# Patient Record
Sex: Female | Born: 1945
Health system: Southern US, Community
[De-identification: ages and names within clinical notes are randomized; demographics above are authoritative.]

## PROBLEM LIST (undated history)

## (undated) DIAGNOSIS — R51 Headache: Secondary | ICD-10-CM

## (undated) DIAGNOSIS — N6009 Solitary cyst of unspecified breast: Secondary | ICD-10-CM

## (undated) DIAGNOSIS — I519 Heart disease, unspecified: Secondary | ICD-10-CM

## (undated) DIAGNOSIS — J302 Other seasonal allergic rhinitis: Secondary | ICD-10-CM

## (undated) DIAGNOSIS — R519 Headache, unspecified: Secondary | ICD-10-CM

## (undated) DIAGNOSIS — I10 Essential (primary) hypertension: Secondary | ICD-10-CM

## (undated) DIAGNOSIS — E669 Obesity, unspecified: Secondary | ICD-10-CM

## (undated) DIAGNOSIS — N63 Unspecified lump in unspecified breast: Secondary | ICD-10-CM

## (undated) DIAGNOSIS — R002 Palpitations: Secondary | ICD-10-CM

## (undated) HISTORY — DX: Essential (primary) hypertension: I10

## (undated) HISTORY — DX: Solitary cyst of unspecified breast: N60.09

## (undated) HISTORY — DX: Unspecified lump in unspecified breast: N63.0

## (undated) HISTORY — DX: Obesity, unspecified: E66.9

## (undated) HISTORY — PX: TUBAL LIGATION: SHX77

## (undated) HISTORY — DX: Heart disease, unspecified: I51.9

---

## 1985-05-30 HISTORY — PX: MECKEL DIVERTICULUM EXCISION: SHX314

## 1985-05-30 HISTORY — PX: ABDOMINAL HYSTERECTOMY: SHX81

## 1985-05-30 HISTORY — PX: APPENDECTOMY: SHX54

## 2003-05-31 DIAGNOSIS — I10 Essential (primary) hypertension: Secondary | ICD-10-CM

## 2003-05-31 HISTORY — DX: Essential (primary) hypertension: I10

## 2008-05-30 HISTORY — PX: BREAST BIOPSY: SHX20

## 2010-05-30 HISTORY — PX: FOOT SURGERY: SHX648

## 2010-08-03 ENCOUNTER — Inpatient Hospital Stay: Payer: Self-pay | Admitting: Orthopedic Surgery

## 2011-02-23 ENCOUNTER — Ambulatory Visit: Payer: Self-pay

## 2011-05-31 DIAGNOSIS — N63 Unspecified lump in unspecified breast: Secondary | ICD-10-CM

## 2011-05-31 HISTORY — DX: Unspecified lump in unspecified breast: N63.0

## 2012-03-06 ENCOUNTER — Ambulatory Visit: Payer: Self-pay

## 2012-05-30 DIAGNOSIS — N6009 Solitary cyst of unspecified breast: Secondary | ICD-10-CM

## 2012-05-30 HISTORY — PX: UPPER GI ENDOSCOPY: SHX6162

## 2012-05-30 HISTORY — DX: Solitary cyst of unspecified breast: N60.09

## 2012-05-30 HISTORY — PX: COLONOSCOPY: SHX174

## 2012-10-19 ENCOUNTER — Other Ambulatory Visit: Payer: Self-pay | Admitting: Gastroenterology

## 2012-10-19 DIAGNOSIS — R1310 Dysphagia, unspecified: Secondary | ICD-10-CM

## 2012-10-29 ENCOUNTER — Ambulatory Visit
Admission: RE | Admit: 2012-10-29 | Discharge: 2012-10-29 | Disposition: A | Discharge status: Home / Self Care | Payer: BC Managed Care – PPO | Source: Ambulatory Visit | Attending: Gastroenterology | Admitting: Gastroenterology

## 2012-10-29 DIAGNOSIS — R1310 Dysphagia, unspecified: Secondary | ICD-10-CM

## 2012-11-05 ENCOUNTER — Encounter: Payer: Self-pay | Admitting: *Deleted

## 2012-11-05 DIAGNOSIS — N63 Unspecified lump in unspecified breast: Secondary | ICD-10-CM | POA: Insufficient documentation

## 2012-11-21 ENCOUNTER — Ambulatory Visit: Payer: Self-pay | Admitting: Gastroenterology

## 2013-03-13 ENCOUNTER — Ambulatory Visit: Payer: Self-pay

## 2013-03-13 ENCOUNTER — Encounter: Payer: Self-pay | Admitting: General Surgery

## 2013-03-21 ENCOUNTER — Ambulatory Visit: Payer: Self-pay | Admitting: General Surgery

## 2013-04-18 ENCOUNTER — Ambulatory Visit (INDEPENDENT_AMBULATORY_CARE_PROVIDER_SITE_OTHER): Payer: BC Managed Care – PPO | Admitting: General Surgery

## 2013-04-18 ENCOUNTER — Encounter: Payer: Self-pay | Admitting: General Surgery

## 2013-04-18 VITALS — BP 126/74 | HR 70 | Resp 12 | Ht <= 58 in | Wt 143.0 lb

## 2013-04-18 DIAGNOSIS — Z9889 Other specified postprocedural states: Secondary | ICD-10-CM

## 2013-04-18 DIAGNOSIS — N6019 Diffuse cystic mastopathy of unspecified breast: Secondary | ICD-10-CM

## 2013-04-18 DIAGNOSIS — Z1239 Encounter for other screening for malignant neoplasm of breast: Secondary | ICD-10-CM

## 2013-04-18 NOTE — Progress Notes (Signed)
Patient ID: Almeda Ezra, female   DOB: [birth year], 67 y.o.   MRN: [id number]  Chief Complaint  Patient presents with  . Follow-up    9 month follow up screening mammogram     HPI Veronia Laprise is a 67 y.o. female who presents for a follow up breast evaluation. The most recent mammogram was done on 03/13/13. Patient does perform regular self breast checks and gets regular mammograms done.  She is on prednisone taper and Cipro for an upper respiratory infection. No new breast issues.   HPI  Past Medical History  Diagnosis Date  . Hypertension 2005  . Lump or mass in breast 2013    right breast fna done 03/22/12, negative  . Obesity, unspecified   . Solitary cyst of breast 2014  . Heart disease     Past Surgical History  Procedure Laterality Date  . Appendectomy  1987  . Meckel diverticulum excision  1987    with abscess  . Abdominal hysterectomy  1987  . Foot surgery Right 2012    ankle fracture/surgery/plate    Family History  Problem Relation Age of Onset  . Cancer Mother     ovarian cancer  . Cancer Maternal Uncle     lung  . Cancer Maternal Grandmother     mouth  . Cancer Paternal Uncle     lung    Social History History  Substance Use Topics  . Smoking status: Never Smoker   . Smokeless tobacco: Not on file  . Alcohol Use: No    Allergies  Allergen Reactions  . Codeine Other (See Comments)    headache  . Penicillins Swelling    Current Outpatient Prescriptions  Medication Sig Dispense Refill  . albuterol (PROVENTIL HFA;VENTOLIN HFA) 108 (90 BASE) MCG/ACT inhaler Inhale into the lungs every 6 (six) hours as needed for wheezing or shortness of breath.      . calcium & magnesium carbonates (MYLANTA) 311-232 MG per tablet Take 1 tablet by mouth daily.      . ciprofloxacin (CIPRO) 500 MG tablet Take 500 mg by mouth 2 (two) times daily.      [name] estradiol (ESTRACE) 2 MG tablet Take 2 mg by mouth daily.      . furosemide (LASIX) 40 MG tablet Take 40 mg by  mouth daily.      [name] loratadine (CLARITIN) 10 MG tablet Take 10 mg by mouth daily.      [name] losartan (COZAAR) 25 MG tablet Take 25 mg by mouth daily.      [name] omeprazole (PRILOSEC) 40 MG capsule Take 40 mg by mouth daily.      . potassium citrate (UROCIT-K) 10 MEQ (1080 MG) SR tablet Take 10 mEq by mouth 3 (three) times daily with meals.      . predniSONE (STERAPRED UNI-PAK) 10 MG tablet Take by mouth daily.       No current facility-administered medications for this visit.    Review of Systems Review of Systems  Constitutional: Negative.   Respiratory: Negative.   Cardiovascular: Negative.     Blood pressure 126/74, pulse 70, resp. rate 12, height 4\' 9"  (1.448 m), weight 143 lb (64.864 kg).  Physical Exam Physical Exam  Constitutional: She is oriented to person, place, and time. She appears well-developed and well-nourished.  Eyes: Conjunctivae are normal. No scleral icterus.  Neck: Neck supple.  Cardiovascular: Normal rate, regular rhythm and normal heart sounds.   Pulmonary/Chest: Effort normal and breath sounds normal. Right breast exhibits  no inverted nipple, no mass, no nipple discharge, no skin change and no tenderness. Left breast exhibits no inverted nipple, no mass, no nipple discharge, no skin change and no tenderness.  Lymphadenopathy:    She has no cervical adenopathy.    She has no axillary adenopathy.  Neurological: She is alert and oriented to person, place, and time.  Skin: Skin is warm and dry.    Data Reviewed Mammogram reviewed and stable.  Assessment    Stable physical exam. History of FCD.     Plan    One year follow up with bilateral screening mammogram and office visit.       Marrie Chandra G 04/19/2013, 1:17 PM

## 2013-04-18 NOTE — Patient Instructions (Addendum)
Continue self breast exams. Call office for any new breast issues or concerns. Fungal cream ( over the counter) as needed. One year follow up with bilateral screening mammogram and office visit.

## 2013-04-19 ENCOUNTER — Encounter: Payer: Self-pay | Admitting: General Surgery

## 2013-05-28 ENCOUNTER — Ambulatory Visit: Payer: Self-pay | Admitting: Internal Medicine

## 2013-07-02 ENCOUNTER — Ambulatory Visit: Payer: Self-pay | Admitting: Family Medicine

## 2013-08-21 ENCOUNTER — Emergency Department: Payer: Self-pay | Admitting: Emergency Medicine

## 2013-08-21 LAB — BASIC METABOLIC PANEL
ANION GAP: 6 — AB (ref 7–16)
BUN: 17 mg/dL (ref 7–18)
Calcium, Total: 9.1 mg/dL (ref 8.5–10.1)
Chloride: 100 mmol/L (ref 98–107)
Co2: 31 mmol/L (ref 21–32)
Creatinine: 1.23 mg/dL (ref 0.60–1.30)
EGFR (African American): 53 — ABNORMAL LOW
EGFR (Non-African Amer.): 46 — ABNORMAL LOW
Glucose: 108 mg/dL — ABNORMAL HIGH (ref 65–99)
Osmolality: 276 (ref 275–301)
POTASSIUM: 4.2 mmol/L (ref 3.5–5.1)
Sodium: 137 mmol/L (ref 136–145)

## 2013-08-21 LAB — CBC WITH DIFFERENTIAL/PLATELET
BASOS ABS: 0.1 10*3/uL (ref 0.0–0.1)
BASOS PCT: 0.5 %
EOS ABS: 0 10*3/uL (ref 0.0–0.7)
Eosinophil %: 0 %
HCT: 38.4 % (ref 35.0–47.0)
HGB: 12.5 g/dL (ref 12.0–16.0)
LYMPHS PCT: 9 %
Lymphocyte #: 1.2 10*3/uL (ref 1.0–3.6)
MCH: 29.4 pg (ref 26.0–34.0)
MCHC: 32.5 g/dL (ref 32.0–36.0)
MCV: 91 fL (ref 80–100)
Monocyte #: 0.2 x10 3/mm (ref 0.2–0.9)
Monocyte %: 1.7 %
Neutrophil #: 11.5 10*3/uL — ABNORMAL HIGH (ref 1.4–6.5)
Neutrophil %: 88.8 %
PLATELETS: 295 10*3/uL (ref 150–440)
RBC: 4.25 10*6/uL (ref 3.80–5.20)
RDW: 14.1 % (ref 11.5–14.5)
WBC: 12.9 10*3/uL — ABNORMAL HIGH (ref 3.6–11.0)

## 2013-08-21 LAB — TROPONIN I

## 2013-08-22 LAB — URINALYSIS, COMPLETE
BILIRUBIN, UR: NEGATIVE
Bacteria: NONE SEEN
Blood: NEGATIVE
GLUCOSE, UR: NEGATIVE mg/dL (ref 0–75)
Hyaline Cast: 3
Ketone: NEGATIVE
LEUKOCYTE ESTERASE: NEGATIVE
Nitrite: NEGATIVE
Ph: 7 (ref 4.5–8.0)
Protein: NEGATIVE
Specific Gravity: 1.015 (ref 1.003–1.030)
Squamous Epithelial: 2
WBC UR: <1 /HPF (ref 0–5)

## 2013-08-22 LAB — TROPONIN I: Troponin-I: <0.02 ng/mL

## 2013-11-13 ENCOUNTER — Ambulatory Visit: Payer: Self-pay | Admitting: Physical Medicine and Rehabilitation

## 2014-01-01 ENCOUNTER — Ambulatory Visit: Payer: Self-pay | Admitting: Orthopedic Surgery

## 2014-01-01 LAB — CBC
HCT: 37.9 % (ref 35.0–47.0)
HGB: 12.3 g/dL (ref 12.0–16.0)
MCH: 29.6 pg (ref 26.0–34.0)
MCHC: 32.4 g/dL (ref 32.0–36.0)
MCV: 91 fL (ref 80–100)
Platelet: 259 10*3/uL (ref 150–440)
RBC: 4.15 10*6/uL (ref 3.80–5.20)
RDW: 13.6 % (ref 11.5–14.5)
WBC: 7 10*3/uL (ref 3.6–11.0)

## 2014-01-01 LAB — URINALYSIS, COMPLETE
BILIRUBIN, UR: NEGATIVE
Bacteria: NONE SEEN
Blood: NEGATIVE
Glucose,UR: NEGATIVE mg/dL (ref 0–75)
KETONE: NEGATIVE
LEUKOCYTE ESTERASE: NEGATIVE
NITRITE: NEGATIVE
PH: 5 (ref 4.5–8.0)
PROTEIN: NEGATIVE
RBC,UR: 1 /HPF (ref 0–5)
Specific Gravity: 1.025 (ref 1.003–1.030)
Squamous Epithelial: 44

## 2014-01-01 LAB — BASIC METABOLIC PANEL
ANION GAP: 9 (ref 7–16)
BUN: 10 mg/dL (ref 7–18)
CHLORIDE: 100 mmol/L (ref 98–107)
CREATININE: 0.61 mg/dL (ref 0.60–1.30)
Calcium, Total: 8.3 mg/dL — ABNORMAL LOW (ref 8.5–10.1)
Co2: 28 mmol/L (ref 21–32)
EGFR (African American): >60
EGFR (Non-African Amer.): >60
Glucose: 113 mg/dL — ABNORMAL HIGH (ref 65–99)
Osmolality: 274 (ref 275–301)
Potassium: 3.8 mmol/L (ref 3.5–5.1)
Sodium: 137 mmol/L (ref 136–145)

## 2014-01-01 LAB — APTT: Activated PTT: 27.2 secs (ref 23.6–35.9)

## 2014-01-01 LAB — PROTIME-INR
INR: 1
Prothrombin Time: 13 secs (ref 11.5–14.7)

## 2014-01-16 ENCOUNTER — Ambulatory Visit: Payer: Self-pay | Admitting: Orthopedic Surgery

## 2014-01-16 HISTORY — PX: SHOULDER SURGERY: SHX246

## 2014-03-31 ENCOUNTER — Encounter: Payer: Self-pay | Admitting: General Surgery

## 2014-06-17 ENCOUNTER — Ambulatory Visit: Payer: Self-pay | Admitting: General Surgery

## 2014-06-19 ENCOUNTER — Ambulatory Visit (INDEPENDENT_AMBULATORY_CARE_PROVIDER_SITE_OTHER): Payer: Commercial Managed Care - HMO | Admitting: General Surgery

## 2014-06-19 ENCOUNTER — Encounter: Payer: Self-pay | Admitting: General Surgery

## 2014-06-19 VITALS — BP 130/90 | HR 80 | Resp 14 | Ht <= 58 in | Wt 156.0 lb

## 2014-06-19 DIAGNOSIS — Z9889 Other specified postprocedural states: Secondary | ICD-10-CM | POA: Diagnosis not present

## 2014-06-19 DIAGNOSIS — N6019 Diffuse cystic mastopathy of unspecified breast: Secondary | ICD-10-CM | POA: Diagnosis not present

## 2014-06-19 DIAGNOSIS — Z1239 Encounter for other screening for malignant neoplasm of breast: Secondary | ICD-10-CM

## 2014-06-19 NOTE — Patient Instructions (Addendum)
Patient to have additional views of right breast. Follow up to be determined. Continue self breast exams. Call office for any new breast issues or concerns.  Patient is scheduled for additional views of the right breast on 07-02-14 at 10 am at [facility].

## 2014-06-19 NOTE — Progress Notes (Signed)
Patient ID: Mariah Hicks, female   DOB: 30-Apr-1946, 69 y.o.   MRN: [id number]  Chief Complaint  Patient presents with  . Follow-up    mammogram     HPI Mariah Hicks is a 69 y.o. female who presents for a breast evaluation. The most recent mammogram was done on 06/17/14. Patient does perform regular self breast checks and gets regular mammograms done.  No new complaints at this time.    HPI  Past Medical History  Diagnosis Date  . Hypertension 2005  . Lump or mass in breast 2013    right breast fna done 03/22/12, negative  . Obesity, unspecified   . Solitary cyst of breast 2014  . Heart disease     Past Surgical History  Procedure Laterality Date  . Appendectomy  1987  . Meckel diverticulum excision  1987    with abscess  . Abdominal hysterectomy  1987  . Foot surgery Right 2012    ankle fracture/surgery/plate  . Shoulder surgery Right 01/16/14    Family History  Problem Relation Age of Onset  . Cancer Mother     ovarian cancer  . Cancer Maternal Uncle     lung  . Cancer Maternal Grandmother     mouth  . Cancer Paternal Uncle     lung    Social History History  Substance Use Topics  . Smoking status: Never Smoker   . Smokeless tobacco: Not on file  . Alcohol Use: No    Allergies  Allergen Reactions  . Codeine Other (See Comments)    headache  . Penicillins Swelling  . Oxycodone Rash and Swelling    Current Outpatient Prescriptions  Medication Sig Dispense Refill  . albuterol (PROVENTIL HFA;VENTOLIN HFA) 108 (90 BASE) MCG/ACT inhaler Inhale into the lungs every 6 (six) hours as needed for wheezing or shortness of breath.    . Calcium Carbonate-Vitamin D (CALCIUM + D PO) Take by mouth.    . cyclobenzaprine (FLEXERIL) 5 MG tablet Take 5 mg by mouth 3 (three) times daily as needed for muscle spasms.    [name] estradiol (ESTRACE) 2 MG tablet Take 2 mg by mouth daily.    . fluticasone (FLONASE) 50 MCG/ACT nasal spray     . furosemide (LASIX) 40  MG tablet Take 40 mg by mouth daily.    [name] loratadine (CLARITIN) 10 MG tablet Take 10 mg by mouth daily.    [name] losartan (COZAAR) 25 MG tablet Take 25 mg by mouth daily.    . meloxicam (MOBIC) 7.5 MG tablet Take 7.5 mg by mouth daily.    [name] MENEST 2.5 MG TABS     . omeprazole (PRILOSEC) 40 MG capsule Take 40 mg by mouth daily.    . potassium chloride (K-DUR) 10 MEQ tablet      No current facility-administered medications for this visit.    Review of Systems Review of Systems  Constitutional: Negative.   Respiratory: Negative.   Cardiovascular: Negative.     Blood pressure 130/90, pulse 80, resp. rate 14, height 4\' 9"  (1.448 m), weight 156 lb (70.761 kg).  Physical Exam Physical Exam  Constitutional: She is oriented to person, place, and time. She appears well-developed and well-nourished.  Eyes: Conjunctivae are normal. No scleral icterus.  Neck: Neck supple. No thyromegaly present.  Cardiovascular: Normal rate, regular rhythm and normal heart sounds.   No murmur heard. Pulmonary/Chest: Effort normal and breath sounds normal. Right breast exhibits no inverted nipple, no mass, no nipple discharge,  no skin change and no tenderness. Left breast exhibits no inverted nipple, no mass, no nipple discharge, no skin change and no tenderness.  Abdominal: Soft. Normal appearance and bowel sounds are normal. There is no hepatosplenomegaly. There is no tenderness. No hernia.  Lymphadenopathy:    She has no cervical adenopathy.    She has no axillary adenopathy.  Neurological: She is alert and oriented to person, place, and time.  Skin: Skin is warm and dry.    Data Reviewed Mammogram reviewed there is a questionable distortion on the right breast additional films are to be performed.   Assessment     Exam is stable. Need additional films of the right breast to insure normalcy.     Plan    Arrange additional views. If okay she will follow up in 1 year bilateral screening mammogram.      Patient is scheduled for additional views of the right breast on 07-02-14 at 10 am [facility]).    Mariah Hicks G 06/19/2014, 9:01 PM

## 2014-07-14 ENCOUNTER — Ambulatory Visit: Payer: Self-pay | Admitting: General Surgery

## 2014-07-15 ENCOUNTER — Encounter: Payer: Self-pay | Admitting: General Surgery

## 2014-09-20 NOTE — Op Note (Signed)
PATIENT NAME:  Mariah Hicks, Mariah Hicks MR#:  [id number] DATE OF BIRTH:  [birth year]  DATE OF PROCEDURE:  01/16/2014  PREOPERATIVE DIAGNOSES:  Right shoulder full-thickness supraspinatus tear, acromioclavicular joint arthrosis, and subacromial impingement.  POSTOPERATIVE DIAGNOSES:  Right shoulder full-thickness supraspinatus tear, acromioclavicular joint arthrosis, and subacromial impingement.  PROCEDURE: Right shoulder arthroscopic subacromial decompression, and distal clavicle excision with mini open rotator cuff tear repair.   ANESTHESIA: General with right interscalene block.   SURGEON:  Timoteo Gaul, MD   ESTIMATED BLOOD LOSS: Minimal.   COMPLICATIONS: None.   IMPLANTS: ArthroCare Magnum M anchor x 1, and Magnum 2 anchors x 3.   INDICATION FOR THE PROCEDURE: The patient is a 69 year old female who had sustained a fall, injuring her right shoulder. She has had persistent pain with limitation of motion as a result of her injury. An MRI has revealed a tear of the row of the supraspinatus. The patient was also having radicular symptoms in her right upper extremity, and has been working with  Dr. Sharlet Salina, and addressing the symptoms related to her cervical spine. She will follow up with Dr. Sharlet Salina within a week of surgery for further treatment.   I met the patient along with her husband in the preoperative area. I signed the right shoulder with my initials according to the hospital's right site protocol. This was after verbally confirming with the patient that this was the correct site of surgery. This was also confirmed with my office notes, and the radiographic studies. I updated the patient's history and physical. She was then brought to the operating room where she underwent an interscalene block by the anesthesia service. She then underwent general endotracheal intubation, and was transferred to a hospital bed where she was positioned in a beach chair position. All bony prominences were  adequately padded, including the lower extremities. A spider arm positioner was used for this case. Examination under anesthesia revealed full passive range of motion without instability with load and shift testing, and she had a negative sulcus sign.   DESCRIPTION OF PROCEDURE: The patient was then prepped and draped in sterile fashion. A timeout was performed to verify the patient's name, date of birth, medical record number, correct site of surgery, and correct procedure to be performed. It was also used to verify the patient had received antibiotics and that all appropriate instruments, implants, and radiographic studies were available in the room. Once all in attendance were in agreement, the case began.   The patient's bony landmarks were drawn out with a surgical marker along with proposed arthroscopy incisions. These were pre-injected with 1% lidocaine plain. An 11 blade was used to create the posterior portal. A hemostat was used to spread the posterior muscle fibers to allow for placement of the arthroscope within the glenohumeral joint. A full diagnostic examination of the glenohumeral joint was undertaken. During the diagnostic portion of this exam, an 18-gauge spinal needle was used for localization for placement of the anterior portal through which a 5.75 mm arthroscopic cannula was placed.   Findings on arthroscopy included fraying of the superior labrum without a SLAP tear. The biceps tendon was intact, as was the subscapularis, and middle glenohumeral ligament. There were no anterior or posterior labral tears. There were no loose bodies in the inferior recess. There were no large full-thickness chondral lesions within the humeral head or glenoid. The patient did have; however, a full-thickness tear of the supraspinatus.  A 4-0 resector shaver blade was used to debride the torn  edges of the rotator cuff of the supraspinatus. A lateral portal was created using an 18-gauge spinal needle. The 4-0  resector shaver blade was also placed through this portal to allow for debridement of the supraspinatus until healthy tendon edges were achieved. A single Smart stitch was placed through the lateral portal into the rotator cuff as viewed through the glenohumeral joint. Final arthroscopic images of the glenohumeral joint were then performed. The arthroscope was then placed in the subacromial space.   A bursectomy and debridement of the subacromial space was performed with a 4-0 resector shaver blade, and a 90 degrees ArthroCare wand. A 5.5 mm resector shaver blade was then used to perform a subacromial decompression through the lateral portal. The 5.5 mm resector shaver blade was then placed through the anterior portal, and a distal clavicle excision was performed. A second Smart stitch was placed under direct visualization through the lateral portal into the lateral border of the rotator cuff tear. All arthroscopic instruments were then removed after final arthroscopic images were taken.   A saber-type incision was made along the lateral border of the acromion. Soft tissues were dissected using a Metzenbaum scissor, and pick-up along with electrocautery. This allowed for visualization of the deltoid fascia. The deltoid was split in line with its fibers, and the sutures from the previously placed Smart stitches were brought out through the deltoid split. A shoulder self-retaining retractor was placed which allowed for visualization of the rotator cuff. Remaining deltoid bursa was dissected with a Metzenbaum scissor and pick-up. A third Smart stitch was placed in the lateral border of the rotator cuff. A drill hole was then placed at the articular margin with the humeral head, and a Magnum M anchor was placed at this margin. A first-pass suture passer was used to place the 4 limbs of this anchor through the medial portion of the rotator cuff for later medial row repair. Three drill holes were then placed over the  lateral edge of the greater tuberosity. In each hole, a single Magnum 2 anchor was placed after it was loaded with 1 on the Smart stitches. These were then tensioned to allow for excellent reduction of the rotator cuff to its original insertion on the greater tuberosity. The tuberosity had been gently burred with a 5.5 mm resector shaver blade to remove all remaining fibers of the torn rotator cuff, and to allow for punctate bleeding to help the rotator cuff to improve bone tending healing. Once the lateral row had been repaired, the sutures from the Magnum M anchor were tied down by hand for medial row fixation.   External images of the rotator cuff were then taken with the arthroscope. The arthroscope was then placed back through the posterior portal into the glenohumeral joint, and intra-articular images of the rotator cuff were also taken.   All instruments were then removed from the shoulder. The deltoid split was then copiously irrigated. The deltoid fascia was closed with 0 Vicryl and the subcutaneous tissue closed with 2-0 Vicryl. A single 2-0 suture was placed in each of the arthroscopy portals. The portals were closed with 4-0 nylon, and the skin of the saber-type incision was closed with a running 4-0 undyed Monocryl.   Steri-Strips were applied over all of the incisions. A dry sterile dressing was applied over the right shoulder incisions. TENS unit pads were placed along with a Polar Care sleeve, and the patient was placed in an abduction sling.   She was then awakened and brought  to the PACU in stable condition.   I was scrubbed and present for the entire case, and all sharp and instrument counts were correct at the conclusion of the case.   I spoke with the patient's husband in the postoperative consultation room to let him know the case had gone without complication, and that his wife was stable in the recovery room.    ____________________________ Timoteo Gaul,  MD klk:[id number] D: 01/17/2014 17:35:11 ET T: 01/17/2014 18:44:33 ET JOB#: [id number]  cc: Timoteo Gaul, MD, <Dictator> Timoteo Gaul MD ELECTRONICALLY SIGNED 01/26/2014 10:59

## 2015-04-16 ENCOUNTER — Other Ambulatory Visit: Payer: Self-pay | Admitting: *Deleted

## 2015-04-16 DIAGNOSIS — Z1231 Encounter for screening mammogram for malignant neoplasm of breast: Secondary | ICD-10-CM

## 2015-06-19 ENCOUNTER — Ambulatory Visit
Admission: RE | Admit: 2015-06-19 | Discharge: 2015-06-19 | Disposition: A | Discharge status: Home / Self Care | Payer: Medicare HMO | Source: Ambulatory Visit | Attending: General Surgery | Admitting: General Surgery

## 2015-06-19 ENCOUNTER — Other Ambulatory Visit: Payer: Self-pay | Admitting: General Surgery

## 2015-06-19 DIAGNOSIS — Z1231 Encounter for screening mammogram for malignant neoplasm of breast: Secondary | ICD-10-CM

## 2015-06-29 ENCOUNTER — Ambulatory Visit: Payer: Commercial Managed Care - HMO | Admitting: General Surgery

## 2015-07-07 ENCOUNTER — Ambulatory Visit (INDEPENDENT_AMBULATORY_CARE_PROVIDER_SITE_OTHER): Payer: Medicare HMO | Admitting: General Surgery

## 2015-07-07 ENCOUNTER — Encounter: Payer: Self-pay | Admitting: General Surgery

## 2015-07-07 VITALS — BP 140/72 | HR 76 | Resp 14 | Ht <= 58 in | Wt 151.0 lb

## 2015-07-07 DIAGNOSIS — Z803 Family history of malignant neoplasm of breast: Secondary | ICD-10-CM

## 2015-07-07 DIAGNOSIS — N6019 Diffuse cystic mastopathy of unspecified breast: Secondary | ICD-10-CM

## 2015-07-07 NOTE — Progress Notes (Signed)
Patient ID: Mariah Hicks, female   DOB: September 21, 1945, 70 y.o.   MRN: [id number]  Chief Complaint  Patient presents with  . Follow-up    mammogram    HPI Mariah Hicks is a 70 y.o. female who presents for a breast evaluation. The most recent mammogram was done on 06/26/15 .  Patient does perform regular self breast checks and gets regular mammograms done.   I have reviewed the history of present illness with the patient.[name]  HPI  Past Medical History  Diagnosis Date  . Hypertension 2005  . Lump or mass in breast 2013    right breast fna done 03/22/12, negative  . Obesity, unspecified   . Solitary cyst of breast 2014  . Heart disease     Past Surgical History  Procedure Laterality Date  . Appendectomy  1987  . Meckel diverticulum excision  1987    with abscess  . Abdominal hysterectomy  1987  . Foot surgery Right 2012    ankle fracture/surgery/plate  . Shoulder surgery Right 01/16/14  . Breast biopsy Left 2010    neg    Family History  Problem Relation Age of Onset  . Ovarian cancer Mother   . Breast cancer Mother   . Cancer Maternal Uncle     lung  . Cancer Maternal Grandmother     mouth  . Cancer Paternal Uncle     lung    Social History Social History  Substance Use Topics  . Smoking status: Never Smoker   . Smokeless tobacco: Not on file  . Alcohol Use: No    Allergies  Allergen Reactions  . Codeine Other (See Comments)    headache  . Penicillins Swelling  . Oxycodone Rash and Swelling    Current Outpatient Prescriptions  Medication Sig Dispense Refill  . albuterol (PROVENTIL HFA;VENTOLIN HFA) 108 (90 BASE) MCG/ACT inhaler Inhale into the lungs every 6 (six) hours as needed for wheezing or shortness of breath.    . Calcium Carbonate-Vitamin D (CALCIUM + D PO) Take by mouth.    . cyclobenzaprine (FLEXERIL) 5 MG tablet Take 5 mg by mouth 3 (three) times daily as needed for muscle spasms.    [name] estradiol (ESTRACE) 2 MG tablet Take 2 mg by  mouth daily.    . fluticasone (FLONASE) 50 MCG/ACT nasal spray     . furosemide (LASIX) 40 MG tablet Take 40 mg by mouth daily.    . isosorbide mononitrate (IMDUR) 30 MG 24 hr tablet Take 30 mg by mouth daily.   11  . loratadine (CLARITIN) 10 MG tablet Take 10 mg by mouth daily.    [name] losartan (COZAAR) 25 MG tablet Take 25 mg by mouth daily.    . meloxicam (MOBIC) 7.5 MG tablet Take 7.5 mg by mouth daily.    [name] MENEST 2.5 MG TABS     . omeprazole (PRILOSEC) 40 MG capsule Take 40 mg by mouth daily.    . potassium chloride (K-DUR) 10 MEQ tablet      No current facility-administered medications for this visit.    Review of Systems Review of Systems  Constitutional: Negative.   Respiratory: Negative.   Cardiovascular: Negative.     Blood pressure 140/72, pulse 76, resp. rate 14, height 4\' 9"  (1.448 m), weight 151 lb (68.493 kg).  Physical Exam Physical Exam  Constitutional: She is oriented to person, place, and time. She appears well-developed and well-nourished.  Eyes: Conjunctivae are normal. No scleral icterus.  Neck: Neck  supple.  Cardiovascular: Normal rate, regular rhythm and normal heart sounds.   Pulmonary/Chest: Effort normal and breath sounds normal. Right breast exhibits no inverted nipple, no mass, no nipple discharge, no skin change and no tenderness. Left breast exhibits no inverted nipple, no mass, no nipple discharge, no skin change and no tenderness.  Abdominal: Soft. Bowel sounds are normal. There is no hepatomegaly. There is no tenderness.  Lymphadenopathy:    She has no cervical adenopathy.    She has no axillary adenopathy.  Neurological: She is alert and oriented to person, place, and time.  Skin: Skin is warm.    Data Reviewed Mammogram reviewed-stable  Assessment    Stable exam  FCD, family history of breast cancer     Plan       The patient has been asked to return to the office in one year with a bilateral screening mammogram. PCP:  Doy Hutching, This  information has been scribed by [name] CMA.    Mariah Hicks G 07/07/2015, 2:08 PM

## 2015-07-07 NOTE — Patient Instructions (Signed)
The patient has been asked to return to the office in one year with a bilateral screening mammogram. 

## 2016-05-06 ENCOUNTER — Other Ambulatory Visit: Payer: Self-pay

## 2016-05-06 DIAGNOSIS — Z1231 Encounter for screening mammogram for malignant neoplasm of breast: Secondary | ICD-10-CM

## 2016-07-05 ENCOUNTER — Ambulatory Visit
Admission: RE | Admit: 2016-07-05 | Discharge: 2016-07-05 | Disposition: A | Discharge status: Home / Self Care | Payer: Medicare HMO | Source: Ambulatory Visit | Attending: General Surgery | Admitting: General Surgery

## 2016-07-05 ENCOUNTER — Other Ambulatory Visit: Payer: Self-pay | Admitting: General Surgery

## 2016-07-05 DIAGNOSIS — Z1231 Encounter for screening mammogram for malignant neoplasm of breast: Secondary | ICD-10-CM | POA: Insufficient documentation

## 2016-07-06 ENCOUNTER — Encounter: Payer: Self-pay | Admitting: *Deleted

## 2016-07-12 ENCOUNTER — Ambulatory Visit: Payer: Medicare HMO | Admitting: General Surgery

## 2016-07-13 NOTE — Discharge Instructions (Signed)
[facility] °[facility] ° °POST OPERATIVE INSTRUCTIONS FOR DR. TROXLER AND DR. FOWLER °[facility] ° ° °1. Take your medication as prescribed.  Pain medication should be taken only as needed. ° °2. Keep the dressing clean, dry and intact. ° °3. Keep your foot elevated above the heart level for the first 48 hours. ° °4. Walking to the bathroom and brief periods of walking are acceptable, unless we have instructed you to be non-weight bearing. ° °5. Always wear your post-op shoe when walking.  Always use your crutches if you are to be non-weight bearing. ° °6. Do not take a shower. Baths are permissible as long as the foot is kept out of the water.  ° °7. Every hour you are awake:  °- Bend your knee 15 times. °- Flex foot 15 times °- Massage calf 15 times ° °8. Call [facility] ([phone]) if any of the following problems occur: °- You develop a temperature or fever. °- The bandage becomes saturated with blood. °- Medication does not stop your pain. °- Injury of the foot occurs. °- Any symptoms of infection including redness, odor, or red streaks running from wound. ° ° °General Anesthesia, Adult, Care After °These instructions provide you with information about caring for yourself after your procedure. Your health care provider may also give you more specific instructions. Your treatment has been planned according to current medical practices, but problems sometimes occur. Call your health care provider if you have any problems or questions after your procedure. °What can I expect after the procedure? °After the procedure, it is common to have: °· Vomiting. °· A sore throat. °· Mental slowness. °It is common to feel: °· Nauseous. °· Cold or shivery. °· Sleepy. °· Tired. °· Sore or achy, even in parts of your body where you did not have surgery. °Follow these instructions at home: °For at least 24 hours after the procedure: °· Do not: °¨ Participate in  activities where you could fall or become injured. °¨ Drive. °¨ Use heavy machinery. °¨ Drink alcohol. °¨ Take sleeping pills or medicines that cause drowsiness. °¨ Make important decisions or sign legal documents. °¨ Take care of children on your own. °· Rest. °Eating and drinking °· If you vomit, drink water, juice, or soup when you can drink without vomiting. °· Drink enough fluid to keep your urine clear or pale yellow. °· Make sure you have little or no nausea before eating solid foods. °· Follow the diet recommended by your health care provider. °General instructions °· Have a responsible adult stay with you until you are awake and alert. °· Return to your normal activities as told by your health care provider. Ask your health care provider what activities are safe for you. °· Take over-the-counter and prescription medicines only as told by your health care provider. °· If you smoke, do not smoke without supervision. °· Keep all follow-up visits as told by your health care provider. This is important. °Contact a health care provider if: °· You continue to have nausea or vomiting at home, and medicines are not helpful. °· You cannot drink fluids or start eating again. °· You cannot urinate after 8-12 hours. °· You develop a skin rash. °· You have fever. °· You have increasing redness at the site of your procedure. °Get help right away if: °· You have difficulty breathing. °· You have chest pain. °· You have unexpected bleeding. °· You feel that you are having   a life-threatening or urgent problem. °This information is not intended to replace advice given to you by your health care provider. Make sure you discuss any questions you have with your health care provider. °Document Released: 08/22/2000 Document Revised: 10/19/2015 Document Reviewed: 04/30/2015 °Elsevier Interactive Patient Education © 2017 [employer] ° °

## 2016-07-15 ENCOUNTER — Encounter: Payer: Self-pay | Admitting: *Deleted

## 2016-07-20 ENCOUNTER — Encounter
Admission: RE | Disposition: A | Discharge status: Home / Self Care | Payer: Self-pay | Source: Ambulatory Visit | Attending: Podiatry

## 2016-07-20 ENCOUNTER — Ambulatory Visit: Payer: Medicare HMO | Admitting: Anesthesiology

## 2016-07-20 ENCOUNTER — Ambulatory Visit
Admission: RE | Admit: 2016-07-20 | Discharge: 2016-07-20 | Disposition: A | Discharge status: Home / Self Care | Payer: Medicare HMO | Source: Ambulatory Visit | Attending: Podiatry | Admitting: Podiatry

## 2016-07-20 DIAGNOSIS — M216X1 Other acquired deformities of right foot: Secondary | ICD-10-CM | POA: Diagnosis not present

## 2016-07-20 DIAGNOSIS — Z880 Allergy status to penicillin: Secondary | ICD-10-CM | POA: Insufficient documentation

## 2016-07-20 DIAGNOSIS — I10 Essential (primary) hypertension: Secondary | ICD-10-CM | POA: Diagnosis not present

## 2016-07-20 DIAGNOSIS — M94271 Chondromalacia, right ankle and joints of right foot: Secondary | ICD-10-CM | POA: Diagnosis not present

## 2016-07-20 DIAGNOSIS — M19171 Post-traumatic osteoarthritis, right ankle and foot: Secondary | ICD-10-CM | POA: Insufficient documentation

## 2016-07-20 DIAGNOSIS — Z472 Encounter for removal of internal fixation device: Secondary | ICD-10-CM | POA: Diagnosis not present

## 2016-07-20 DIAGNOSIS — Z885 Allergy status to narcotic agent status: Secondary | ICD-10-CM | POA: Diagnosis not present

## 2016-07-20 DIAGNOSIS — M65871 Other synovitis and tenosynovitis, right ankle and foot: Secondary | ICD-10-CM | POA: Diagnosis not present

## 2016-07-20 HISTORY — PX: HARDWARE REMOVAL: SHX979

## 2016-07-20 HISTORY — DX: Headache, unspecified: R51.9

## 2016-07-20 HISTORY — PX: ANKLE ARTHROSCOPY: SHX545

## 2016-07-20 HISTORY — DX: Palpitations: R00.2

## 2016-07-20 HISTORY — DX: Headache: R51

## 2016-07-20 SURGERY — ARTHROSCOPY, ANKLE
Anesthesia: Regional | Site: Ankle | Laterality: Right | Wound class: Clean

## 2016-07-20 MED ORDER — TRAMADOL HCL 50 MG PO TABS: 50.0000 mg | ORAL_TABLET | Freq: Four times a day (QID) | ORAL | 0 refills | Status: DC | PRN

## 2016-07-20 MED ORDER — HYDROCODONE-ACETAMINOPHEN 5-325 MG PO TABS: 1.0000 | ORAL_TABLET | Freq: Four times a day (QID) | ORAL | 0 refills | Status: DC | PRN

## 2016-07-20 MED ORDER — LACTATED RINGERS IV SOLN
INTRAVENOUS | Status: DC
  Administered 2016-07-20 (×2): via INTRAVENOUS

## 2016-07-20 MED ORDER — FENTANYL CITRATE (PF) 100 MCG/2ML IJ SOLN
INTRAMUSCULAR | Status: DC | PRN
  Administered 2016-07-20 (×2): 50 ug via INTRAVENOUS

## 2016-07-20 MED ORDER — MIDAZOLAM HCL 5 MG/5ML IJ SOLN
INTRAMUSCULAR | Status: DC | PRN
  Administered 2016-07-20: 2 mg via INTRAVENOUS
  Administered 2016-07-20 (×2): 1 mg via INTRAVENOUS

## 2016-07-20 MED ORDER — DEXAMETHASONE SODIUM PHOSPHATE 4 MG/ML IJ SOLN
INTRAMUSCULAR | Status: DC | PRN
  Administered 2016-07-20: 4 mg via INTRAVENOUS

## 2016-07-20 MED ORDER — CLINDAMYCIN PHOSPHATE 600 MG/50ML IV SOLN
600.0000 mg | Freq: Once | INTRAVENOUS | Status: AC
  Administered 2016-07-20: 600 mg via INTRAVENOUS

## 2016-07-20 MED ORDER — LIDOCAINE HCL (CARDIAC) 20 MG/ML IV SOLN
INTRAVENOUS | Status: DC | PRN
  Administered 2016-07-20: 40 mg via INTRATRACHEAL

## 2016-07-20 MED ORDER — ROPIVACAINE HCL 5 MG/ML IJ SOLN
INTRAMUSCULAR | Status: DC | PRN
  Administered 2016-07-20: 40 mL via PERINEURAL

## 2016-07-20 MED ORDER — ONDANSETRON HCL 4 MG/2ML IJ SOLN
INTRAMUSCULAR | Status: DC | PRN
  Administered 2016-07-20: 4 mg via INTRAVENOUS

## 2016-07-20 MED ORDER — GLYCOPYRROLATE 0.2 MG/ML IJ SOLN
INTRAMUSCULAR | Status: DC | PRN
  Administered 2016-07-20: .1 mg via INTRAVENOUS

## 2016-07-20 MED ORDER — PROPOFOL 10 MG/ML IV BOLUS
INTRAVENOUS | Status: DC | PRN
Start: 2016-07-20 — End: 2016-07-20
  Administered 2016-07-20: 120 mg via INTRAVENOUS

## 2016-07-20 SURGICAL SUPPLY — 50 items
ARTHROWAND PARAGON T2 (SURGICAL WAND) ×2
BANDAGE ELASTIC 4 LF NS (GAUZE/BANDAGES/DRESSINGS) ×2 IMPLANT
BLADE AGGRESSIVE PLUS 4.0 (BLADE) ×2 IMPLANT
BNDG COHESIVE 4X5 TAN STRL (GAUZE/BANDAGES/DRESSINGS) ×2 IMPLANT
BNDG ESMARK 4X12 TAN STRL LF (GAUZE/BANDAGES/DRESSINGS) ×2 IMPLANT
BNDG GAUZE 4.5X4.1 6PLY STRL (MISCELLANEOUS) ×2 IMPLANT
BUR AGGRESSIVE+ 2.5 (BURR) ×2 IMPLANT
CAST PADDING 3X4FT ST 30246 (SOFTGOODS) ×1
CUFF TOURN SGL QUICK 24 (TOURNIQUET CUFF)
CUFF TRNQT CYL 24X4X40X1 (TOURNIQUET CUFF) IMPLANT
DRAPE FLUOR MINI C-ARM 54X84 (DRAPES) ×2 IMPLANT
DURAPREP 26ML APPLICATOR (WOUND CARE) ×2 IMPLANT
ELECT REM PT RETURN 9FT ADLT (ELECTROSURGICAL) ×2
ELECTRODE REM PT RTRN 9FT ADLT (ELECTROSURGICAL) ×1 IMPLANT
ETHIBOND 2 0 GREEN CT 2 30IN (SUTURE) IMPLANT
GAUZE PETRO XEROFOAM 1X8 (MISCELLANEOUS) ×2 IMPLANT
GLOVE BIO SURGEON STRL SZ7.5 (GLOVE) ×4 IMPLANT
GLOVE INDICATOR 8.0 STRL GRN (GLOVE) ×4 IMPLANT
GOWN STRL REUS W/ TWL LRG LVL3 (GOWN DISPOSABLE) ×2 IMPLANT
GOWN STRL REUS W/TWL LRG LVL3 (GOWN DISPOSABLE) ×2
IV LACTATED RINGER IRRG 3000ML (IV SOLUTION) ×5
IV LR IRRIG 3000ML ARTHROMATIC (IV SOLUTION) ×5 IMPLANT
KIT ROOM TURNOVER OR (KITS) ×2 IMPLANT
MANIFOLD 4PT FOR NEPTUNE1 (MISCELLANEOUS) ×2 IMPLANT
NEEDLE HYPO 25GX1X1/2 BEV (NEEDLE) IMPLANT
NS IRRIG 500ML POUR BTL (IV SOLUTION) ×2 IMPLANT
PACK EXTREMITY ARMC (MISCELLANEOUS) ×2 IMPLANT
PAD CAST CTTN 3X4 STRL (SOFTGOODS) ×1 IMPLANT
STOCKINETTE IMPERVIOUS LG (DRAPES) ×2 IMPLANT
STRAP ANKLE DISTRACTOR (MISCELLANEOUS) IMPLANT
STRAP ANKLE FOOT DISTRACTOR (ORTHOPEDIC SUPPLIES) IMPLANT
STRAP BODY AND KNEE 60X3 (MISCELLANEOUS) ×2 IMPLANT
STRIP CLOSURE SKIN 1/4X4 (GAUZE/BANDAGES/DRESSINGS) ×2 IMPLANT
STRYKET ARTHROSCOPIC SHAVER BLADE ×2 IMPLANT
SUT ETHIBOND GREEN BRAID 0S 4 (SUTURE) IMPLANT
SUT ETHILON 3-0 FS-10 30 BLK (SUTURE) ×4
SUT ETHILON 4-0 (SUTURE)
SUT ETHILON 4-0 FS2 18XMFL BLK (SUTURE)
SUT VIC AB 3-0 SH 27 (SUTURE) ×1
SUT VIC AB 3-0 SH 27X BRD (SUTURE) ×1 IMPLANT
SUT VIC AB 4-0 FS2 27 (SUTURE) ×2 IMPLANT
SUT VIC AB 4-0 SH 27 (SUTURE)
SUT VIC AB 4-0 SH 27XANBCTRL (SUTURE) IMPLANT
SUTURE EHLN 3-0 FS-10 30 BLK (SUTURE) ×2 IMPLANT
SUTURE ETHLN 4-0 FS2 18XMF BLK (SUTURE) IMPLANT
SWABSTK COMLB BENZOIN TINCTURE (MISCELLANEOUS) ×2 IMPLANT
TUBING ARTHRO INFLOW-ONLY STRL (TUBING) ×2 IMPLANT
WAND ARTHRO PARAGON T2 (SURGICAL WAND) ×1 IMPLANT
WAND COVAC 50 IFS (MISCELLANEOUS) ×2 IMPLANT
WAND TOPAZ MICRO DEBRIDER (MISCELLANEOUS) IMPLANT

## 2016-07-20 NOTE — Anesthesia Preprocedure Evaluation (Signed)
Anesthesia Evaluation  Patient identified by MRN, date of birth, ID band Patient awake    Reviewed: Allergy & Precautions, H&P , NPO status , Patient's Chart, lab work & pertinent test results  Airway Mallampati: II  TM Distance: >3 FB Neck ROM: full    Dental  (+) Edentulous Upper, Edentulous Lower   Pulmonary neg pulmonary ROS,    Pulmonary exam normal        Cardiovascular hypertension, On Medications Normal cardiovascular exam     Neuro/Psych  Headaches,    GI/Hepatic negative GI ROS, Neg liver ROS,   Endo/Other    Renal/GU negative Renal ROS     Musculoskeletal   Abdominal   Peds  Hematology negative hematology ROS (+)   Anesthesia Other Findings   Reproductive/Obstetrics                            Anesthesia Physical Anesthesia Plan  ASA: II  Anesthesia Plan: General LMA and Regional   Post-op Pain Management: GA combined w/ Regional for post-op pain   Induction:   Airway Management Planned:   Additional Equipment:   Intra-op Plan:   Post-operative Plan:   Informed Consent: I have reviewed the patients History and Physical, chart, labs and discussed the procedure including the risks, benefits and alternatives for the proposed anesthesia with the patient or authorized representative who has indicated his/her understanding and acceptance.     Plan Discussed with:   Anesthesia Plan Comments:         Anesthesia Quick Evaluation

## 2016-07-20 NOTE — Anesthesia Procedure Notes (Signed)
Anesthesia Regional Block: Popliteal block   Pre-Anesthetic Checklist: ,, timeout performed, Correct Patient, Correct Site, Correct Laterality, Correct Procedure, Correct Position, site marked, Risks and benefits discussed,  Surgical consent,  Pre-op evaluation,  At surgeon's request and post-op pain management  Laterality: Right  Prep: chloraprep       Needles:  Injection technique: Single-shot  Needle Type: Echogenic Needle     Needle Length: 9cm  Needle Gauge: 21     Additional Needles:   Procedures: ultrasound guided,,,,,,,,  Narrative:  Start time: 07/20/2016 1:21 PM End time: 07/20/2016 1:37 PM Injection made incrementally with aspirations every 5 mL.  Performed by: Personally  Anesthesiologist: Elgie Collard  Additional Notes: Functioning IV was confirmed and monitors applied. Ultrasound guidance: relevant anatomy identified, needle position confirmed, local anesthetic spread visualized around nerve(s)., vascular puncture avoided.  Image printed for medical record.  Negative aspiration and no paresthesias; incremental administration of local anesthetic. The patient tolerated the procedure well. Vitals signes recorded in RN notes.

## 2016-07-20 NOTE — Anesthesia Procedure Notes (Signed)
Anesthesia Regional Block: Adductor canal block   Pre-Anesthetic Checklist: ,, timeout performed, Correct Patient, Correct Site, Correct Laterality, Correct Procedure, Correct Position, site marked, Risks and benefits discussed,  Surgical consent,  Pre-op evaluation,  At surgeon's request and post-op pain management  Laterality: Right  Prep: chloraprep       Needles:  Injection technique: Single-shot  Needle Type: Echogenic Needle     Needle Length: 9cm  Needle Gauge: 21     Additional Needles:   Procedures: ultrasound guided,,,,,,,,  Narrative:  Start time: 07/20/2016 1:21 PM End time: 07/20/2016 1:37 PM Injection made incrementally with aspirations every 5 mL.  Performed by: Personally  Anesthesiologist: Elgie Collard  Additional Notes: Functioning IV was confirmed and monitors applied. Ultrasound guidance: relevant anatomy identified, needle position confirmed, local anesthetic spread visualized around nerve(s)., vascular puncture avoided.  Image printed for medical record.  Negative aspiration and no paresthesias; incremental administration of local anesthetic. The patient tolerated the procedure well. Vitals signes recorded in RN notes.

## 2016-07-20 NOTE — Anesthesia Postprocedure Evaluation (Signed)
Anesthesia Post Note  Patient: Mariah Hicks  Procedure(s) Performed: Procedure(s) (LRB): ANKLE ARTHROSCOPY  debridement right (Right) HARDWARE REMOVAL (Right)  Patient location during evaluation: PACU Anesthesia Type: Regional and General Level of consciousness: awake and alert Pain management: pain level controlled Vital Signs Assessment: post-procedure vital signs reviewed and stable Respiratory status: spontaneous breathing Cardiovascular status: blood pressure returned to baseline Postop Assessment: no headache Anesthetic complications: no    Jaci Standard, III,  Leno Mathes D

## 2016-07-20 NOTE — Anesthesia Procedure Notes (Signed)
Procedure Name: LMA Insertion Date/Time: 07/20/2016 2:33 PM Performed by: Mayme Genta Pre-anesthesia Checklist: Patient identified, Emergency Drugs available, Suction available, Timeout performed and Patient being monitored Patient Re-evaluated:Patient Re-evaluated prior to inductionOxygen Delivery Method: Circle system utilized Preoxygenation: Pre-oxygenation with 100% oxygen Intubation Type: IV induction LMA: LMA inserted LMA Size: 3.0 Number of attempts: 1 Placement Confirmation: positive ETCO2 and breath sounds checked- equal and bilateral Tube secured with: Tape

## 2016-07-20 NOTE — Op Note (Signed)
Operative note   Surgeon:Shelli Portilla Lawyer: None    Preop diagnosis: 1. Posttraumatic right ankle arthritis 2. Retained hardware lateral right ankle 3. Retained hardware medial right ankle    Postop diagnosis: 1. Extensive posttraumatic osteoarthritis right ankle 2. Osteochondral defect anterior lateral right ankle 3. Retained lateral ankle fibular plate and screws 4. Retained medial malleolar screws    Procedure: 1. Arthroscopic extensive debridement right ankle 2. Arthroscopic assistance osteochondral defect repair right ankle 3 removal of painful fibular plate and screws right ankle for removal of painful medial malleolus screws right ankle    EBL: Minimal    Anesthesia:regional and general    Hemostasis: Thigh tourniquet inflated to 250 mmHg for 96 minutes    Specimen: None    Complications: None    Operative indications:Mariah Hicks is an 71 y.o. that presents today for surgical intervention.  The risks/benefits/alternatives/complications have been discussed and consent has been given.    Procedure:  Patient was brought into the OR and placed on the operating table in thesupine position. After anesthesia was obtained theright lower extremity was prepped and draped in usual sterile fashion.  Attention was initially directed to the anteromedial and anterolateral aspect of the right ankle. Small stab incisions were made on the anteromedial portal and anterolateral portal. Blunt dissection carried down to the ankle joint. The anteromedial ankle joint was entered with a small joints arthroscopies scope. The ankle joint was then evaluated under arthroscopic evaluation. Extensive amount of scar tissue was noted on the anterior aspect of the ankle with periarticular spurring and loose scar tissue diffusely throughout the ankle joint. Large amounts of synovitis noted as well. There was noted to be large areas of chondromalacia. Going to the syndesmosis was noted as well. Extensive  debridement was performed with a combination of a T5 and 40 aggressive shaver. This was taken down to much better healthier tissue. A CoVac 50 wand was also used to debride and remove scar tissue with fibrotic tissue on the anterior aspect of the ankle joint. This was also used for hemostasis. Noted to be a moderate-sized osteochondral defect on the anterior lateral to anterior central aspect of the ankle joint. This was debrided with an aggressive shaver. A Paragon wand was then used to clean the edges and remove loose chondromalacia from the defect. The syndesmosis was evaluated and there was marked scarring with a loose fibrous band in the region. This was also excised. After extensive amounts of debridement was performed off felt that the ankle joint had much better Freer motion without impingement. All equipment was then removed from the ankle joint.   Attention was directed to the lateral aspect of the ankle where a large longitudinal incision was made along the fibula. Sharp and blunt dissection was carried down to the deeper fascial region. Scar tissue was noted diffusely throughout the dissection. The peroneal tendon was noted just posterior to the fibula. At this time further dissection revealed the large fibular plate. All screws were removed from the lateral aspect of the ankle and the fibular plate was removed in toto. A posterior to anterior compression screw was noted on the posterior aspect of the fibula and this was also removed. This wound was then flushed with copious amounts or irrigation and closure was performed with 3-0 Vicryl and 3-0 nylon for skin.  Attention was then directed to the medial aspect of the ankle where a medial malleolus incision was performed. Sharp and blunt dissection carried down to  the periosteum. Subperiosteal dissection was undertaken. 2 medial malleolus screws were noted and removed from the surgical field in toto. Fluoroscopy was then used to evaluate the ankle and  all hardware was removed. Layered closure was performed of the medial malleolus incision with 3-0 Vicryl and 3-0 nylon. The anteromedial and anterolateral portals were closed with 3-0 nylon. Patient was placed in a well compressive sterile dressing and placed in an equalizer walker boot.    Patient tolerated the procedure and anesthesia well.  Was transported from the OR to the PACU with all vital signs stable and vascular status intact. To be discharged per routine protocol.  Will follow up in approximately 1 week in the outpatient clinic.

## 2016-07-20 NOTE — H&P (Signed)
HISTORY AND PHYSICAL INTERVAL NOTE:  07/20/2016  2:02 PM  Mariah Hicks  has presented today for surgery, with the diagnosis of M19.171 Post Traumatic Osteoarthritis right ankle.  The various methods of treatment have been discussed with the patient.  No guarantees were given.  After consideration of risks, benefits and other options for treatment, the patient has consented to surgery.  I have reviewed the patients' chart and labs.    Patient Vitals for the past 24 hrs:  BP Temp Temp src Pulse Resp SpO2 Height Weight  07/20/16 1335 117/72 - - 80 11 100 % - -  07/20/16 1325 (!) 116/58 - - 81 12 100 % - -  07/20/16 1320 (!) 164/93 - - 73 (!) 21 100 % - -  07/20/16 1244 129/81 98.4 F (36.9 C) Tympanic 83 16 100 % 4\' 9"  (1.448 m) 68 kg (150 lb)    A history and physical examination was performed in my office.  The patient was reexamined.  There have been no changes to this history and physical examination.  Samara Deist A

## 2016-07-20 NOTE — Transfer of Care (Signed)
Immediate Anesthesia Transfer of Care Note  Patient: Mariah Hicks  Procedure(s) Performed: Procedure(s) with comments: ANKLE ARTHROSCOPY  debridement right (Right) HARDWARE REMOVAL (Right) - 8 hole plate removed 9 screws removed plate and screws removed intact  Patient Location: PACU  Anesthesia Type: General LMA, Regional  Level of Consciousness: awake, alert  and patient cooperative  Airway and Oxygen Therapy: Patient Spontanous Breathing and Patient connected to supplemental oxygen  Post-op Assessment: Post-op Vital signs reviewed, Patient's Cardiovascular Status Stable, Respiratory Function Stable, Patent Airway and No signs of Nausea or vomiting  Post-op Vital Signs: Reviewed and stable  Complications: No apparent anesthesia complications

## 2016-07-21 ENCOUNTER — Encounter: Payer: Self-pay | Admitting: Podiatry

## 2016-08-11 ENCOUNTER — Ambulatory Visit (INDEPENDENT_AMBULATORY_CARE_PROVIDER_SITE_OTHER): Payer: Medicare HMO | Admitting: General Surgery

## 2016-08-11 ENCOUNTER — Encounter: Payer: Self-pay | Admitting: General Surgery

## 2016-08-11 VITALS — BP 120/70 | HR 76 | Resp 12 | Ht 62.0 in | Wt 125.0 lb

## 2016-08-11 DIAGNOSIS — Z803 Family history of malignant neoplasm of breast: Secondary | ICD-10-CM | POA: Diagnosis not present

## 2016-08-11 DIAGNOSIS — N6019 Diffuse cystic mastopathy of unspecified breast: Secondary | ICD-10-CM

## 2016-08-11 NOTE — Progress Notes (Signed)
Patient ID: GAVRIELLA HEARST, female   DOB: 1945-07-17, 71 y.o.   MRN: [id number]  Chief Complaint  Patient presents with  . Follow-up    HPI IRAIS MOTTRAM is a 71 y.o. female who presents for a breast evaluation. The most recent mammogram was done on 07-05-16.  Patient does perform regular self breast checks and gets regular mammograms done. I have reviewed the history of present illness with the patient.      HPI  Past Medical History:  Diagnosis Date  . Headache    pinched nerve in neck.  Chiropracor monthly has relieved.  [name] Heart disease   . Hypertension 2005  . Lump or mass in breast 2013   right breast fna done 03/22/12, negative  . Obesity, unspecified   . Palpitations   . Solitary cyst of breast 2014    Past Surgical History:  Procedure Laterality Date  . ABDOMINAL HYSTERECTOMY  1987  . ANKLE ARTHROSCOPY Right 07/20/2016   Procedure: ANKLE ARTHROSCOPY  debridement right;  Surgeon: Samara Deist, DPM;  Location: [facility];  Service: Podiatry;  Laterality: Right;  . APPENDECTOMY  1987  . BREAST BIOPSY Left 2010   neg  . FOOT SURGERY Right 2012   ankle fracture/surgery/plate  . HARDWARE REMOVAL Right 07/20/2016   Procedure: HARDWARE REMOVAL;  Surgeon: Samara Deist, DPM;  Location: [facility];  Service: Podiatry;  Laterality: Right;  8 hole plate removed 9 screws removed plate and screws removed intact  . MECKEL DIVERTICULUM EXCISION  1987   with abscess  . SHOULDER SURGERY Right 01/16/14    Family History  Problem Relation Age of Onset  . Ovarian cancer Mother   . Breast cancer Mother   . Cancer Maternal Uncle     lung  . Cancer Maternal Grandmother     mouth  . Cancer Paternal Uncle     lung    Social History Social History  Substance Use Topics  . Smoking status: Never Smoker  . Smokeless tobacco: Never Used  . Alcohol use No    Allergies  Allergen Reactions  . Codeine Other (See Comments)    headache  . Penicillins  Swelling  . Oxycodone Rash and Swelling    Current Outpatient Prescriptions  Medication Sig Dispense Refill  . albuterol (PROVENTIL HFA;VENTOLIN HFA) 108 (90 BASE) MCG/ACT inhaler Inhale into the lungs every 6 (six) hours as needed for wheezing or shortness of breath.    . Calcium Carbonate-Vitamin D (CALCIUM + D PO) Take by mouth.    . cyclobenzaprine (FLEXERIL) 5 MG tablet Take 5 mg by mouth 3 (three) times daily as needed for muscle spasms.    [name] estradiol (ESTRACE) 2 MG tablet Take 2 mg by mouth daily.    . fluticasone (FLONASE) 50 MCG/ACT nasal spray     . furosemide (LASIX) 40 MG tablet Take 40 mg by mouth daily.    [name] HYDROcodone-acetaminophen (NORCO) 5-325 MG tablet Take 1-2 tablets by mouth every 6 (six) hours as needed for moderate pain. 30 tablet 0  . isosorbide mononitrate (IMDUR) 30 MG 24 hr tablet Take 30 mg by mouth daily.   11  . loratadine (CLARITIN) 10 MG tablet Take 10 mg by mouth daily.    [name] losartan (COZAAR) 25 MG tablet Take 25 mg by mouth daily.    . meloxicam (MOBIC) 7.5 MG tablet Take 7.5 mg by mouth daily.    [name] omeprazole (PRILOSEC) 40 MG capsule Take 40 mg by mouth daily.    [name]  potassium chloride (K-DUR) 10 MEQ tablet     . traMADol (ULTRAM) 50 MG tablet Take 1 tablet (50 mg total) by mouth every 6 (six) hours as needed. 30 tablet 0   No current facility-administered medications for this visit.     Review of Systems Review of Systems  Constitutional: Negative.   Respiratory: Negative.   Cardiovascular: Negative.     Blood pressure 120/70, pulse 76, resp. rate 12, height 5\' 2"  (1.575 m), weight 125 lb (56.7 kg).  Physical Exam Physical Exam  Constitutional: She is oriented to person, place, and time. She appears well-developed and well-nourished.  Eyes: Conjunctivae are normal. No scleral icterus.  Neck: Neck supple.  Cardiovascular: Normal rate, regular rhythm and normal heart sounds.   Pulmonary/Chest: Effort normal and breath sounds normal. Right  breast exhibits no inverted nipple, no mass, no nipple discharge, no skin change and no tenderness. Left breast exhibits no inverted nipple, no mass, no nipple discharge, no skin change and no tenderness.  Abdominal: Soft. Bowel sounds are normal. There is no tenderness.  Lymphadenopathy:    She has no cervical adenopathy.    She has no axillary adenopathy.  Neurological: She is alert and oriented to person, place, and time.  Skin: Skin is warm and dry.  Psychiatric: Her behavior is normal.    Data Reviewed Mammogram and past notes reviewed   Assessment    Stable exam  FCD, family history of breast cancer     Plan    Patient will be asked to return to the office in one year with a bilateral screening mammogram with Dr. Bary Castilla This information has been scribed by [name] CMA.    SANKAR,SEEPLAPUTHUR G 08/11/2016, 12:28 PM

## 2016-08-11 NOTE — Patient Instructions (Addendum)
The patient is aware to call back for any questions or concerns.    Patient will be asked to return to the office in one year with a bilateral screening mammogram with Dr Byrnett 

## 2017-05-29 ENCOUNTER — Other Ambulatory Visit: Payer: Self-pay | Admitting: Internal Medicine

## 2017-05-29 DIAGNOSIS — Z1231 Encounter for screening mammogram for malignant neoplasm of breast: Secondary | ICD-10-CM

## 2017-07-07 ENCOUNTER — Ambulatory Visit
Admission: RE | Admit: 2017-07-07 | Discharge: 2017-07-07 | Disposition: A | Discharge status: Home / Self Care | Payer: Medicare HMO | Source: Ambulatory Visit | Attending: Internal Medicine | Admitting: Internal Medicine

## 2017-07-07 DIAGNOSIS — Z1231 Encounter for screening mammogram for malignant neoplasm of breast: Secondary | ICD-10-CM | POA: Insufficient documentation

## 2017-07-18 ENCOUNTER — Ambulatory Visit: Payer: Medicare HMO | Admitting: General Surgery

## 2017-08-08 ENCOUNTER — Encounter: Payer: Self-pay | Admitting: General Surgery

## 2017-08-08 ENCOUNTER — Ambulatory Visit: Payer: Medicare HMO | Admitting: General Surgery

## 2017-08-08 VITALS — BP 130/72 | HR 71 | Resp 12 | Ht 64.0 in | Wt 154.0 lb

## 2017-08-08 DIAGNOSIS — N6019 Diffuse cystic mastopathy of unspecified breast: Secondary | ICD-10-CM

## 2017-08-08 DIAGNOSIS — Z803 Family history of malignant neoplasm of breast: Secondary | ICD-10-CM

## 2017-08-08 NOTE — Patient Instructions (Signed)
  Patient will be asked to return to the office in one year with a bilateral screening mammogram. The patient is aware to call back for any questions or concerns. Follow up appointment to be announced. For abdominal  pain.

## 2017-08-08 NOTE — Progress Notes (Signed)
Patient ID: Mariah Hicks, female   DOB: 1945-10-26, 72 y.o.   MRN: [id number]  Chief Complaint  Patient presents with  . Follow-up    HPI Mariah Hicks is a 72 y.o. female who presents for a breast evaluation. The most recent mammogram was done on 07/07/2017 .  Patient does perform regular self breast checks and gets regular mammograms done.  Patient is taking estradiol  2mg  .  She continues to have vasomotor symptoms on this lower dose.  She reports the symptoms were at their nadir while taking 2.5 mg/day.  She states he has been moving her bowels right after meals in the last four months.  Sometimes she feels more bloated and passes gas more.  She states she has had pain in her right lower quadrant. The pain goes away after she moves her bowels. No foods triggers the pain .  The patient denies the passage of blood or mucus in her stools.  Stools are described as pudding like to loose.  No floating stools.  Consider referral back to GI for further assessment of her change in bowel habits.  HPI  Past Medical History:  Diagnosis Date  . Headache    pinched nerve in neck.  Chiropracor monthly has relieved.  [name] Heart disease   . Hypertension 2005  . Lump or mass in breast 2013   right breast fna done 03/22/12, negative  . Obesity, unspecified   . Palpitations   . Solitary cyst of breast 2014    Past Surgical History:  Procedure Laterality Date  . ABDOMINAL HYSTERECTOMY  1987  . ANKLE ARTHROSCOPY Right 07/20/2016   Procedure: ANKLE ARTHROSCOPY  debridement right;  Surgeon: Samara Deist, DPM;  Location: [facility];  Service: Podiatry;  Laterality: Right;  . APPENDECTOMY  1987  . BREAST BIOPSY Left 2010   neg  . COLONOSCOPY  2014  . FOOT SURGERY Right 2012   ankle fracture/surgery/plate  . HARDWARE REMOVAL Right 07/20/2016   Procedure: HARDWARE REMOVAL;  Surgeon: Samara Deist, DPM;  Location: [facility];  Service: Podiatry;  Laterality: Right;  8 hole plate  removed 9 screws removed plate and screws removed intact  . MECKEL DIVERTICULUM EXCISION  1987   with abscess  . SHOULDER SURGERY Right 01/16/14  . TUBAL LIGATION      Family History  Problem Relation Age of Onset  . Ovarian cancer Mother   . Breast cancer Mother   . Cancer Maternal Uncle        lung  . Cancer Maternal Grandmother        mouth  . Cancer Paternal Uncle        lung    Social History Social History   Tobacco Use  . Smoking status: Never Smoker  . Smokeless tobacco: Never Used  Substance Use Topics  . Alcohol use: No    Alcohol/week: 0.0 oz  . Drug use: No    Allergies  Allergen Reactions  . Codeine Other (See Comments)    headache  . Penicillins Swelling  . Oxycodone Rash and Swelling    Current Outpatient Medications  Medication Sig Dispense Refill  . albuterol (PROVENTIL HFA;VENTOLIN HFA) 108 (90 BASE) MCG/ACT inhaler Inhale into the lungs every 6 (six) hours as needed for wheezing or shortness of breath.    . Calcium Carbonate-Vitamin D (CALCIUM + D PO) Take by mouth.    . cyclobenzaprine (FLEXERIL) 5 MG tablet Take 5 mg by mouth 3 (three) times daily as  needed for muscle spasms.    [name] estradiol (ESTRACE) 2 MG tablet Take 2 mg by mouth daily.    . fluticasone (FLONASE) 50 MCG/ACT nasal spray     . furosemide (LASIX) 40 MG tablet Take 40 mg by mouth daily.    [name] HYDROcodone-acetaminophen (NORCO) 5-325 MG tablet Take 1-2 tablets by mouth every 6 (six) hours as needed for moderate pain. 30 tablet 0  . isosorbide mononitrate (IMDUR) 30 MG 24 hr tablet Take 30 mg by mouth daily.   11  . loratadine (CLARITIN) 10 MG tablet Take 10 mg by mouth daily.    [name] losartan (COZAAR) 25 MG tablet Take 25 mg by mouth daily.    . meloxicam (MOBIC) 7.5 MG tablet Take 7.5 mg by mouth daily.    [name] omeprazole (PRILOSEC) 40 MG capsule Take 40 mg by mouth daily.    . potassium chloride (K-DUR) 10 MEQ tablet     . traMADol (ULTRAM) 50 MG tablet Take 1 tablet (50 mg total) by  mouth every 6 (six) hours as needed. 30 tablet 0   No current facility-administered medications for this visit.     Review of Systems Review of Systems  Constitutional: Negative.   Respiratory: Negative.   Cardiovascular: Negative.   Gastrointestinal: Positive for diarrhea.    Blood pressure 130/72, pulse 71, resp. rate 12, height 5\' 4"  (1.626 m), weight 154 lb (69.9 kg).  Physical Exam Physical Exam  Constitutional: She is oriented to person, place, and time. She appears well-developed and well-nourished.  Eyes: Conjunctivae are normal. No scleral icterus.  Neck: Neck supple.  Cardiovascular: Normal rate, regular rhythm and normal heart sounds.  Pulmonary/Chest: Effort normal and breath sounds normal. Right breast exhibits no inverted nipple, no mass, no nipple discharge, no skin change and no tenderness. Left breast exhibits no inverted nipple, no mass, no nipple discharge, no skin change and no tenderness.  Abdominal: Soft. Bowel sounds are normal. There is no tenderness.  Lymphadenopathy:    She has no cervical adenopathy.    She has no axillary adenopathy.  Neurological: She is alert and oriented to person, place, and time.  Skin: Skin is warm and dry.    Data Reviewed Comprehensive metabolic panel of March 14, 2017 was unremarkable. CBC of the same date showed a hemoglobin of 12.2 with an MCV of 87.6.  White blood cell count of 8200.  Platelet count 322,000. Bilateral screening mammograms dated July 07, 2017 were reviewed.  No interval change.  BI-RADS-1.  November 21, 2012 upper endoscopy and colonoscopy completed for dysphasia and screening respectively were reviewed.  The esophagus was normal as was the stomach and duodenum.  She was dilated to 70 [place].  Colonoscopy was normal.  Assessment    Benign breast exam.  Persistent vasomotor symptoms on estrogen, consider increase to 2.5 mg daily.  Cardiovascular/breast risk reviewed.  New onset of change in bowel  movement, postprandial stools.  No pain to suggest diverticulitis.    Plan  Patient will be asked to return to the office in one year with a bilateral screening mammogram. The patient is aware to call back for any questions or concerns.  The postprandial diarrhea is new, and warrants repeat GI evaluation.  Would strongly consider increasing estradiol to 2.5 mg/day as the patient is symptomatic with vasomotor symptoms and likely assume was minimal if any additional risk with a 25% higher dose.   HPI, Physical Exam, Assessment and Plan have been scribed under the direction and  in the presence of [name], MD.  [name], CMA  I have completed the exam and reviewed the above documentation for accuracy and completeness.  I agree with the above.  Haematologist has been used and any errors in dictation or transcription are unintentional.  [name], M.D., F.A.C.S.  [name] Kairah Leoni 08/09/2017, 8:51 PM

## 2017-08-09 DIAGNOSIS — Z803 Family history of malignant neoplasm of breast: Secondary | ICD-10-CM | POA: Insufficient documentation

## 2017-08-09 DIAGNOSIS — N6019 Diffuse cystic mastopathy of unspecified breast: Secondary | ICD-10-CM | POA: Insufficient documentation

## 2017-08-14 ENCOUNTER — Telehealth: Payer: Self-pay | Admitting: *Deleted

## 2017-08-14 NOTE — Telephone Encounter (Signed)
Patient called back and she is going to contact Dr.Sparks office first since most GI specialist requires a referral .

## 2017-08-14 NOTE — Telephone Encounter (Signed)
-----   Message from Robert Bellow, MD sent at 08/09/2017  8:59 PM EDT ----- Please notify the patient I spoke to Dr. Doy Hutching about increasing her estradiol dose to 2.5 mg daily.  I would suggest that she be reevaluated by GI.  She should contact either Dr. Doy Hutching for referral or the GI office directly.  Thank you

## 2017-10-12 ENCOUNTER — Ambulatory Visit: Payer: Medicare HMO | Admitting: General Surgery

## 2017-11-09 ENCOUNTER — Ambulatory Visit: Payer: Medicare HMO | Admitting: General Surgery

## 2017-12-14 ENCOUNTER — Encounter: Payer: Self-pay | Admitting: General Surgery

## 2017-12-14 ENCOUNTER — Ambulatory Visit: Payer: Medicare HMO | Admitting: General Surgery

## 2017-12-14 VITALS — BP 124/82 | HR 83 | Resp 16 | Ht <= 58 in | Wt 157.0 lb

## 2017-12-14 DIAGNOSIS — R197 Diarrhea, unspecified: Secondary | ICD-10-CM

## 2017-12-14 DIAGNOSIS — R1011 Right upper quadrant pain: Secondary | ICD-10-CM | POA: Diagnosis not present

## 2017-12-14 DIAGNOSIS — R194 Change in bowel habit: Secondary | ICD-10-CM | POA: Diagnosis not present

## 2017-12-14 DIAGNOSIS — R140 Abdominal distension (gaseous): Secondary | ICD-10-CM | POA: Diagnosis not present

## 2017-12-14 MED ORDER — POLYETHYLENE GLYCOL 3350 17 GM/SCOOP PO POWD: 1.0000 | Freq: Once | ORAL | 0 refills | Status: AC

## 2017-12-14 NOTE — Patient Instructions (Addendum)
The patient is aware to call back for any questions or concerns.  Schedule Ultrasound  Upper and lower endoscopy  Colonoscopy, Adult A colonoscopy is an exam to look at the entire large intestine. During the exam, a lubricated, bendable tube is inserted into the anus and then passed into the rectum, colon, and other parts of the large intestine. A colonoscopy is often done as a part of normal colorectal screening or in response to certain symptoms, such as anemia, persistent diarrhea, abdominal pain, and blood in the stool. The exam can help screen for and diagnose medical problems, including:  Tumors.  Polyps.  Inflammation.  Areas of bleeding.  Tell a health care provider about:  Any allergies you have.  All medicines you are taking, including vitamins, herbs, eye drops, creams, and over-the-counter medicines.  Any problems you or family members have had with anesthetic medicines.  Any blood disorders you have.  Any surgeries you have had.  Any medical conditions you have.  Any problems you have had passing stool. What are the risks? Generally, this is a safe procedure. However, problems may occur, including:  Bleeding.  A tear in the intestine.  A reaction to medicines given during the exam.  Infection (rare).  What happens before the procedure? Eating and drinking restrictions Follow instructions from your health care provider about eating and drinking, which may include:  A few days before the procedure - follow a low-fiber diet. Avoid nuts, seeds, dried fruit, raw fruits, and vegetables.  1-3 days before the procedure - follow a clear liquid diet. Drink only clear liquids, such as clear broth or bouillon, black coffee or tea, clear juice, clear soft drinks or sports drinks, gelatin dessert, and popsicles. Avoid any liquids that contain red or purple dye.  On the day of the procedure - do not eat or drink anything during the 2 hours before the procedure, or  within the time period that your health care provider recommends.  Bowel prep If you were prescribed an oral bowel prep to clean out your colon:  Take it as told by your health care provider. Starting the day before your procedure, you will need to drink a large amount of medicated liquid. The liquid will cause you to have multiple loose stools until your stool is almost clear or light green.  If your skin or anus gets irritated from diarrhea, you may use these to relieve the irritation: ? Medicated wipes, such as adult wet wipes with aloe and vitamin E. ? A skin soothing-product like petroleum jelly.  If you vomit while drinking the bowel prep, take a break for up to 60 minutes and then begin the bowel prep again. If vomiting continues and you cannot take the bowel prep without vomiting, call your health care provider.  General instructions  Ask your health care provider about changing or stopping your regular medicines. This is especially important if you are taking diabetes medicines or blood thinners.  Plan to have someone take you home from the hospital or clinic. What happens during the procedure?  An IV tube may be inserted into one of your veins.  You will be given medicine to help you relax (sedative).  To reduce your risk of infection: ? Your health care team will wash or sanitize their hands. ? Your anal area will be washed with soap.  You will be asked to lie on your side with your knees bent.  Your health care provider will lubricate a long, thin, flexible  tube. The tube will have a camera and a light on the end.  The tube will be inserted into your anus.  The tube will be gently eased through your rectum and colon.  Air will be delivered into your colon to keep it open. You may feel some pressure or cramping.  The camera will be used to take images during the procedure.  A small tissue sample may be removed from your body to be examined under a microscope  (biopsy). If any potential problems are found, the tissue will be sent to a lab for testing.  If small polyps are found, your health care provider may remove them and have them checked for cancer cells.  The tube that was inserted into your anus will be slowly removed. The procedure may vary among health care providers and hospitals. What happens after the procedure?  Your blood pressure, heart rate, breathing rate, and blood oxygen level will be monitored until the medicines you were given have worn off.  Do not drive for 24 hours after the exam.  You may have a small amount of blood in your stool.  You may pass gas and have mild abdominal cramping or bloating due to the air that was used to inflate your colon during the exam.  It is up to you to get the results of your procedure. Ask your health care provider, or the department performing the procedure, when your results will be ready. This information is not intended to replace advice given to you by your health care provider. Make sure you discuss any questions you have with your health care provider. Document Released: 05/13/2000 Document Revised: 03/16/2016 Document Reviewed: 07/28/2015 Elsevier Interactive Patient Education  [employer].   The patient is scheduled for an abdominal ultrasound at [facility] on 12/20/17 at 8:45 am. She will arrive by 8:30 am and have nothing to eat or drink for 6 hours prior. The patient is scheduled for a Colonoscopy and upper Endoscopy at [facility] on 01/03/18. They are aware to call the day before to get their arrival time. She will only take her Isosorbide at 6 am with a sip of water the morning of. Miralax prescription has been sent into the patient's pharmacy. The patient is aware of date and instructions.

## 2017-12-14 NOTE — Progress Notes (Signed)
Patient ID: Mariah Hicks, female   DOB: [birth year], 72 y.o.   MRN: [id number]  Chief Complaint  Patient presents with  . Other    HPI Mariah Hicks is a 72 y.o. female here today for a evalaution of change in bowel habits referred by Dr Doy Hutching. She states for about a year she has noticed that if she eats out she has loose stools 4-5 a day right after she eats. If she eats at home she does not notice and problems. She does notice greasy foods are worse, here lately even jelly biscuits cause loose stools. She does admit to bloating and increased gas. She does notice right upper abdominal pain lasting 15-30 minutes occurring about 45 minutes after eating. She has noticed the pain for the past 2-3 months. No medication changes in the past 6 months.  HPI   Past Medical History:  Diagnosis Date  . Headache    pinched nerve in neck.  Chiropracor monthly has relieved.  [name] Heart disease   . Hypertension 2005  . Lump or mass in breast 2013   right breast fna done 03/22/12, negative  . Obesity, unspecified   . Palpitations   . Solitary cyst of breast 2014    Past Surgical History:  Procedure Laterality Date  . ABDOMINAL HYSTERECTOMY  1987  . ANKLE ARTHROSCOPY Right 07/20/2016   Procedure: ANKLE ARTHROSCOPY  debridement right;  Surgeon: Samara Deist, DPM;  Location: [place];  Service: Podiatry;  Laterality: Right;  . APPENDECTOMY  1987  . BREAST BIOPSY Left 2010   neg  . COLONOSCOPY  2014   Dr Candace Cruise  . FOOT SURGERY Right 2012   ankle fracture/surgery/plate  . HARDWARE REMOVAL Right 07/20/2016   Procedure: HARDWARE REMOVAL;  Surgeon: Samara Deist, DPM;  Location: [place];  Service: Podiatry;  Laterality: Right;  8 hole plate removed 9 screws removed plate and screws removed intact  . MECKEL DIVERTICULUM EXCISION  1987   with abscess  . SHOULDER SURGERY Right 01/16/14  . TUBAL LIGATION    . UPPER GI ENDOSCOPY  2014   Dr Candace Cruise    Family History  Problem  Relation Age of Onset  . Ovarian cancer Mother   . Breast cancer Mother   . Cancer Maternal Uncle        lung  . Cancer Maternal Grandmother        mouth  . Cancer Paternal Uncle        lung    Social History Social History   Tobacco Use  . Smoking status: Never Smoker  . Smokeless tobacco: Never Used  Substance Use Topics  . Alcohol use: No    Alcohol/week: 0.0 oz  . Drug use: No    Allergies  Allergen Reactions  . Codeine Other (See Comments)    headache  . Penicillins Swelling  . Oxycodone Rash and Swelling    Current Outpatient Medications  Medication Sig Dispense Refill  . albuterol (PROVENTIL HFA;VENTOLIN HFA) 108 (90 BASE) MCG/ACT inhaler Inhale into the lungs every 6 (six) hours as needed for wheezing or shortness of breath.    . Calcium Carbonate-Vitamin D (CALCIUM + D PO) Take by mouth.    . cyclobenzaprine (FLEXERIL) 5 MG tablet Take 5 mg by mouth 3 (three) times daily as needed for muscle spasms.    [name] estradiol (ESTRACE) 2 MG tablet Take 2 mg by mouth daily.    . fluticasone (FLONASE) 50 MCG/ACT nasal spray     .  furosemide (LASIX) 40 MG tablet Take 40 mg by mouth daily.    [name] HYDROcodone-acetaminophen (NORCO) 5-325 MG tablet Take 1-2 tablets by mouth every 6 (six) hours as needed for moderate pain. 30 tablet 0  . isosorbide mononitrate (IMDUR) 30 MG 24 hr tablet Take 30 mg by mouth daily.   11  . loratadine (CLARITIN) 10 MG tablet Take 10 mg by mouth daily.    [name] losartan (COZAAR) 25 MG tablet Take 25 mg by mouth daily.    . meloxicam (MOBIC) 7.5 MG tablet Take 7.5 mg by mouth daily.    [name] omeprazole (PRILOSEC) 40 MG capsule Take 40 mg by mouth daily.    . potassium chloride (K-DUR) 10 MEQ tablet     . traMADol (ULTRAM) 50 MG tablet Take 1 tablet (50 mg total) by mouth every 6 (six) hours as needed. 30 tablet 0   No current facility-administered medications for this visit.     Review of Systems Review of Systems  Constitutional: Negative.   Respiratory:  Negative.   Cardiovascular: Negative.   Gastrointestinal: Positive for constipation and diarrhea. Negative for blood in stool, nausea and vomiting.    Blood pressure 124/82, pulse 83, resp. rate 16, height 4\' 9"  (1.448 m), weight 157 lb (71.2 kg), SpO2 97 %.  Physical Exam Physical Exam  Constitutional: She is oriented to person, place, and time. She appears well-developed and well-nourished.  HENT:  Mouth/Throat: Oropharynx is clear and moist. No oropharyngeal exudate.  Eyes: Conjunctivae are normal. No scleral icterus.  Neck: Neck supple.  Cardiovascular: Normal rate, regular rhythm and normal heart sounds.  No lower leg edema  Pulmonary/Chest: Effort normal and breath sounds normal.  Abdominal: Soft. Normal appearance. There is tenderness. No hernia.  Lymphadenopathy:    She has no cervical adenopathy.  Neurological: She is alert and oriented to person, place, and time.  Skin: Skin is warm and dry.  Psychiatric: Her behavior is normal.    Data Reviewed Laboratory studies dated September 13, 2017 showed a hemoglobin of 11.8, down minimally from 12.4 in April 2017.  MCV of 88.  White blood cell count of 7700.  Platelet count of 288,000.  Normal differential. Comprehensive metabolic panel of the same date entirely normal.  Creatinine 0.8 with an estimated GFR of 71.  Hemoglobin A1c 5.8.  Assessment    Change in bowel habits with persistent diarrhea.  Abdominal bloating.  Postprandial pain.    Plan    Schedule ultrasound of the gallbladder.  Upper and lower endoscopy with possible biopsy/polypectomy prn: Information regarding the procedure, including its potential risks and complications (including but not limited to perforation of the bowel, which may require emergency surgery to repair, and bleeding) was verbally given to the patient. Educational information regarding lower intestinal endoscopy was given to the patient. Written instructions for how to complete the bowel prep  using Miralax were provided. The importance of drinking ample fluids to avoid dehydration as a result of the prep emphasized.     HPI, Physical Exam, Assessment and Plan have been scribed under the direction and in the presence of Robert Bellow, MD. Karie Fetch, RN  I have completed the exam and reviewed the above documentation for accuracy and completeness.  I agree with the above.  Haematologist has been used and any errors in dictation or transcription are unintentional.  [name], M.D., F.A.C.S.  The patient is scheduled for an abdominal ultrasound at [facility] on 12/20/17 at 8:45 am. She will  arrive by 8:30 am and have nothing to eat or drink for 6 hours prior. The patient is scheduled for a Colonoscopy and upper Endoscopy at [facility] on 01/03/18. They are aware to call the day before to get their arrival time. She will only take her Isosorbide at 6 am with a sip of water the morning of. Miralax prescription has been sent into the patient's pharmacy. The patient is aware of date and instructions.  Documented by Caryl-Lyn Otis Brace LPN   [name] [facility] [place] 12/15/2017, 5:43 PM

## 2017-12-15 DIAGNOSIS — R197 Diarrhea, unspecified: Secondary | ICD-10-CM | POA: Insufficient documentation

## 2017-12-15 DIAGNOSIS — R140 Abdominal distension (gaseous): Secondary | ICD-10-CM | POA: Insufficient documentation

## 2017-12-20 ENCOUNTER — Ambulatory Visit
Admission: RE | Admit: 2017-12-20 | Discharge: 2017-12-20 | Disposition: A | Discharge status: Home / Self Care | Payer: Medicare HMO | Source: Ambulatory Visit | Attending: General Surgery | Admitting: General Surgery

## 2017-12-20 DIAGNOSIS — R1011 Right upper quadrant pain: Secondary | ICD-10-CM | POA: Insufficient documentation

## 2017-12-20 DIAGNOSIS — R932 Abnormal findings on diagnostic imaging of liver and biliary tract: Secondary | ICD-10-CM | POA: Insufficient documentation

## 2017-12-20 DIAGNOSIS — K8020 Calculus of gallbladder without cholecystitis without obstruction: Secondary | ICD-10-CM | POA: Diagnosis not present

## 2017-12-25 ENCOUNTER — Telehealth: Payer: Self-pay

## 2017-12-25 NOTE — Telephone Encounter (Signed)
Notified patient as instructed, patient pleased. Discussed follow-up appointments, patient agrees  

## 2017-12-25 NOTE — Telephone Encounter (Signed)
-----   Message from Robert Bellow, MD sent at 12/22/2017  8:38 PM EDT ----- Please notify the patient gallstones identified. May be contributing to her abdominal symptoms. Will discuss after endoscopy.  ----- Message ----- From: Interface, Rad Results In Sent: 12/20/2017   9:37 AM To: Robert Bellow, MD

## 2017-12-29 ENCOUNTER — Telehealth: Payer: Self-pay | Admitting: *Deleted

## 2017-12-29 ENCOUNTER — Other Ambulatory Visit: Payer: Self-pay

## 2017-12-29 MED ORDER — POLYETHYLENE GLYCOL 3350 17 GM/SCOOP PO POWD: 1.0000 | Freq: Once | ORAL | 0 refills | Status: AC

## 2017-12-29 NOTE — Telephone Encounter (Signed)
Prescription resent to [facility]. Message left for patient about this.

## 2017-12-29 NOTE — Telephone Encounter (Signed)
Patient is having Colonoscopy on 01/03/18 and her pharmacy [facility] has not received any prescription for her Miralax that is she suppose to take before her colonoscopy.

## 2018-01-03 ENCOUNTER — Encounter
Admission: RE | Disposition: A | Discharge status: Home / Self Care | Payer: Self-pay | Source: Ambulatory Visit | Attending: General Surgery

## 2018-01-03 ENCOUNTER — Ambulatory Visit: Payer: Medicare HMO | Admitting: Certified Registered"

## 2018-01-03 ENCOUNTER — Ambulatory Visit
Admission: RE | Admit: 2018-01-03 | Discharge: 2018-01-03 | Disposition: A | Discharge status: Home / Self Care | Payer: Medicare HMO | Source: Ambulatory Visit | Attending: General Surgery | Admitting: General Surgery

## 2018-01-03 DIAGNOSIS — R1013 Epigastric pain: Secondary | ICD-10-CM

## 2018-01-03 DIAGNOSIS — I10 Essential (primary) hypertension: Secondary | ICD-10-CM | POA: Diagnosis not present

## 2018-01-03 DIAGNOSIS — K317 Polyp of stomach and duodenum: Secondary | ICD-10-CM

## 2018-01-03 DIAGNOSIS — R140 Abdominal distension (gaseous): Secondary | ICD-10-CM

## 2018-01-03 DIAGNOSIS — Z79899 Other long term (current) drug therapy: Secondary | ICD-10-CM | POA: Insufficient documentation

## 2018-01-03 DIAGNOSIS — D122 Benign neoplasm of ascending colon: Secondary | ICD-10-CM | POA: Diagnosis not present

## 2018-01-03 DIAGNOSIS — R197 Diarrhea, unspecified: Secondary | ICD-10-CM | POA: Insufficient documentation

## 2018-01-03 DIAGNOSIS — K2950 Unspecified chronic gastritis without bleeding: Secondary | ICD-10-CM | POA: Diagnosis not present

## 2018-01-03 HISTORY — PX: COLONOSCOPY WITH PROPOFOL: SHX5780

## 2018-01-03 HISTORY — PX: ESOPHAGOGASTRODUODENOSCOPY (EGD) WITH PROPOFOL: SHX5813

## 2018-01-03 SURGERY — COLONOSCOPY WITH PROPOFOL
Anesthesia: General

## 2018-01-03 MED ORDER — LIDOCAINE HCL (CARDIAC) PF 100 MG/5ML IV SOSY
PREFILLED_SYRINGE | INTRAVENOUS | Status: DC | PRN
  Administered 2018-01-03: 50 mg via INTRAVENOUS

## 2018-01-03 MED ORDER — SODIUM CHLORIDE 0.9 % IV SOLN
INTRAVENOUS | Status: DC
  Administered 2018-01-03: 1000 mL via INTRAVENOUS

## 2018-01-03 MED ORDER — LIDOCAINE HCL URETHRAL/MUCOSAL 2 % EX GEL
CUTANEOUS | Status: AC
  Filled 2018-01-03: qty 5

## 2018-01-03 MED ORDER — GLYCOPYRROLATE 0.2 MG/ML IJ SOLN
INTRAMUSCULAR | Status: AC
  Filled 2018-01-03: qty 1

## 2018-01-03 MED ORDER — PROPOFOL 10 MG/ML IV BOLUS
INTRAVENOUS | Status: AC
  Filled 2018-01-03: qty 20

## 2018-01-03 MED ORDER — PROPOFOL 500 MG/50ML IV EMUL
INTRAVENOUS | Status: AC
Start: 2018-01-03 — End: ?
  Filled 2018-01-03: qty 50

## 2018-01-03 MED ORDER — LIDOCAINE HCL (PF) 2 % IJ SOLN
INTRAMUSCULAR | Status: AC
  Filled 2018-01-03: qty 10

## 2018-01-03 MED ORDER — GLYCOPYRROLATE 0.2 MG/ML IJ SOLN
INTRAMUSCULAR | Status: DC | PRN
  Administered 2018-01-03: 0.2 mg via INTRAVENOUS

## 2018-01-03 MED ORDER — PROPOFOL 10 MG/ML IV BOLUS
INTRAVENOUS | Status: DC | PRN
  Administered 2018-01-03: 100 mg via INTRAVENOUS
  Administered 2018-01-03: 50 mg via INTRAVENOUS
  Administered 2018-01-03: 100 mg via INTRAVENOUS

## 2018-01-03 MED ORDER — PROPOFOL 500 MG/50ML IV EMUL
INTRAVENOUS | Status: DC | PRN
  Administered 2018-01-03 (×2): via INTRAVENOUS
  Administered 2018-01-03: 100 ug/kg/min via INTRAVENOUS

## 2018-01-03 MED ORDER — SODIUM CHLORIDE 0.9 % IJ SOLN
INTRAMUSCULAR | Status: AC
  Filled 2018-01-03: qty 10

## 2018-01-03 NOTE — Anesthesia Post-op Follow-up Note (Signed)
Anesthesia QCDR form completed.        

## 2018-01-03 NOTE — Op Note (Signed)
[facility] Gastroenterology Patient Name: Mariah Hicks Procedure Date: 01/03/2018 10:40 AM MRN: [id number] Account #: [id number] Date of Birth: May 30, 1946 Admit Type: Outpatient Age: 72 Room: [facility] ENDO ROOM 1 Gender: Female Note Status: Finalized Procedure:            Colonoscopy Indications:          Clinically significant diarrhea of unexplained origin Providers:            Robert Bellow, MD Medicines:            Monitored Anesthesia Care Complications:        No immediate complications. Procedure:            Pre-Anesthesia Assessment:                       - Prior to the procedure, a History and Physical was                        performed, and patient medications, allergies and                        sensitivities were reviewed. The patient's tolerance of                        previous anesthesia was reviewed.                       - The risks and benefits of the procedure and the                        sedation options and risks were discussed with the                        patient. All questions were answered and informed                        consent was obtained.                       After obtaining informed consent, the colonoscope was                        passed under direct vision. Throughout the procedure,                        the patient's blood pressure, pulse, and oxygen                        saturations were monitored continuously. The was                        introduced through the anus and advanced to the the                        cecum, identified by the appendiceal orifice, IC valve                        and transillumination. The colonoscopy was somewhat                        difficult due to significant looping and a  tortuous                        colon. Successful completion of the procedure was aided                        by changing the patient to a supine position. The                        patient tolerated the  procedure well. The quality of                        the bowel preparation was excellent. Findings:      A 5 mm polyp was found in the ascending colon. The polyp was sessile.       Biopsies were taken with a cold forceps for histology. Addtional       biopsies were taken of the right colon and of the left colon (separate       bottles).      The retroflexed view of the distal rectum and anal verge was normal and       showed no anal or rectal abnormalities. Impression:           - One 5 mm polyp in the ascending colon. Biopsied.                       - The distal rectum and anal verge are normal on                        retroflexion view. Recommendation:       - Return to endoscopist in 2 weeks. Procedure Code(s):    --- Professional ---                       [phone], Colonoscopy, flexible; with biopsy, single or                        multiple Diagnosis Code(s):    --- Professional ---                       D12.2, Benign neoplasm of ascending colon                       R19.7, Diarrhea, unspecified CPT copyright 2017 American Medical Association. All rights reserved. The codes documented in this report are preliminary and upon coder review may  be revised to meet current compliance requirements. Robert Bellow, MD 01/03/2018 11:47:04 AM This report has been signed electronically. Number of Addenda: 0 Note Initiated On: 01/03/2018 10:40 AM Scope Withdrawal Time: 0 hours 12 minutes 42 seconds  Total Procedure Duration: 0 hours 32 minutes 5 seconds       [facility]

## 2018-01-03 NOTE — Anesthesia Postprocedure Evaluation (Signed)
Anesthesia Post Note  Patient: Mariah Hicks  Procedure(s) Performed: COLONOSCOPY WITH PROPOFOL (N/A ) ESOPHAGOGASTRODUODENOSCOPY (EGD) WITH PROPOFOL (N/A )  Patient location during evaluation: Endoscopy Anesthesia Type: General Level of consciousness: awake and alert, oriented and patient cooperative Pain management: satisfactory to patient Vital Signs Assessment: post-procedure vital signs reviewed and stable Respiratory status: spontaneous breathing and respiratory function stable Cardiovascular status: blood pressure returned to baseline and stable Postop Assessment: no headache, no backache, patient able to bend at knees, no apparent nausea or vomiting, adequate PO intake and able to ambulate Anesthetic complications: no     Last Vitals:  Vitals:   01/03/18 1018 01/03/18 1147  BP: (!) 186/85 136/70  Pulse: 67 90  Resp: 16 20  Temp: (!) 36.1 C 36.6 C  SpO2: 100% 100%    Last Pain:  Vitals:   01/03/18 1147  TempSrc: Tympanic  PainSc: Asleep                 Duel Conrad H Hal Norrington

## 2018-01-03 NOTE — H&P (Signed)
No change in clinical history or exam. Tolerated prep well. Known cholelithiasis. For upper and lower endoscopy for change in bowel habits.

## 2018-01-03 NOTE — Transfer of Care (Signed)
Immediate Anesthesia Transfer of Care Note  Patient: Mariah Hicks  Procedure(s) Performed: COLONOSCOPY WITH PROPOFOL (N/A ) ESOPHAGOGASTRODUODENOSCOPY (EGD) WITH PROPOFOL (N/A )  Patient Location: PACU  Anesthesia Type:General  Level of Consciousness: drowsy and patient cooperative  Airway & Oxygen Therapy: Patient Spontanous Breathing  Post-op Assessment: Report given to RN, Post -op Vital signs reviewed and stable and Patient moving all extremities  Post vital signs: Reviewed and stable  Last Vitals:  Vitals Value Taken Time  BP 136/70 01/03/2018 11:47 AM  Temp 36.6 C 01/03/2018 11:47 AM  Pulse 89 01/03/2018 11:49 AM  Resp 24 01/03/2018 11:49 AM  SpO2 99 % 01/03/2018 11:49 AM  Vitals shown include unvalidated device data.  Last Pain:  Vitals:   01/03/18 1147  TempSrc: Tympanic  PainSc: Asleep         Complications: No apparent anesthesia complications

## 2018-01-03 NOTE — Op Note (Signed)
[facility] Gastroenterology Patient Name: Mariah Hicks Procedure Date: 01/03/2018 10:41 AM MRN: [id number] Account #: [id number] Date of Birth: 1945-08-26 Admit Type: Outpatient Age: 72 Room: [facility] ENDO ROOM 1 Gender: Female Note Status: Finalized Procedure:            Upper GI endoscopy Indications:          Epigastric abdominal pain, Abdominal pain in the right                        upper quadrant, Diarrhea Providers:            Robert Bellow, MD Referring MD:         Leonie Douglas. Doy Hutching, MD (Referring MD) Complications:        No immediate complications. Procedure:            Pre-Anesthesia Assessment:                       - Prior to the procedure, a History and Physical was                        performed, and patient medications, allergies and                        sensitivities were reviewed. The patient's tolerance of                        previous anesthesia was reviewed.                       - The risks and benefits of the procedure and the                        sedation options and risks were discussed with the                        patient. All questions were answered and informed                        consent was obtained.                       After obtaining informed consent, the endoscope was                        passed under direct vision. Throughout the procedure,                        the patient's blood pressure, pulse, and oxygen                        saturations were monitored continuously. The Endoscope                        was introduced through the mouth, and advanced to the                        third part of duodenum. The upper GI endoscopy was                        accomplished without difficulty. The  patient tolerated                        the procedure well. Findings:      The esophagus was normal.      Multiple 3 mm sessile polyps with no bleeding and no stigmata of recent       bleeding were found in the  gastric body. Biopsies were taken with a cold       forceps for histology. Biopsies of the third portion of the duodenum       taken secondary to history of post prandial diarrhea.      The examined duodenum was normal. Impression:           - Normal esophagus.                       - Multiple gastric polyps. Biopsied.                       - Normal examined duodenum. Recommendation:       - Perform a colonoscopy today. Procedure Code(s):    --- Professional ---                       [phone], Esophagogastroduodenoscopy, flexible, transoral;                        with biopsy, single or multiple Diagnosis Code(s):    --- Professional ---                       K31.7, Polyp of stomach and duodenum                       R10.13, Epigastric pain                       R10.11, Right upper quadrant pain                       R19.7, Diarrhea, unspecified CPT copyright 2017 American Medical Association. All rights reserved. The codes documented in this report are preliminary and upon coder review may  be revised to meet current compliance requirements. Robert Bellow, MD 01/03/2018 11:09:48 AM This report has been signed electronically. Number of Addenda: 0 Note Initiated On: 01/03/2018 10:41 AM      [facility]

## 2018-01-03 NOTE — Anesthesia Preprocedure Evaluation (Signed)
Anesthesia Evaluation  Patient identified by MRN, date of birth, ID band Patient awake    Reviewed: Allergy & Precautions, H&P , NPO status , reviewed documented beta blocker date and time   Airway Mallampati: II  TM Distance: >3 FB Neck ROM: full    Dental  (+) Caps   Pulmonary    Pulmonary exam normal        Cardiovascular hypertension, Normal cardiovascular exam     Neuro/Psych  Headaches,    GI/Hepatic   Endo/Other    Renal/GU      Musculoskeletal   Abdominal   Peds  Hematology   Anesthesia Other Findings Past Medical History: No date: Headache     Comment:  pinched nerve in neck.  Chiropracor monthly has               relieved. No date: Heart disease 2005: Hypertension 2013: Lump or mass in breast     Comment:  right breast fna done 03/22/12, negative No date: Obesity, unspecified No date: Palpitations 2014: Solitary cyst of breast  Past Surgical History: 1987: ABDOMINAL HYSTERECTOMY 07/20/2016: ANKLE ARTHROSCOPY; Right     Comment:  Procedure: ANKLE ARTHROSCOPY  debridement right;                Surgeon: Samara Deist, DPM;  Location: [place];  Service: Podiatry;  Laterality: Right; 1987: APPENDECTOMY 2010: BREAST BIOPSY; Left     Comment:  neg 2014: COLONOSCOPY     Comment:  Dr Candace Cruise 2012: FOOT SURGERY; Right     Comment:  ankle fracture/surgery/plate 07/20/2016: HARDWARE REMOVAL; Right     Comment:  Procedure: HARDWARE REMOVAL;  Surgeon: Samara Deist,               DPM;  Location: [place];  Service: Podiatry;               Laterality: Right;  8 hole plate removed 9 screws               removed plate and screws removed intact 1987: MECKEL DIVERTICULUM EXCISION     Comment:  with abscess 01/16/14: SHOULDER SURGERY; Right No date: TUBAL LIGATION 2014: UPPER GI ENDOSCOPY     Comment:  Dr Candace Cruise  BMI    Body Mass Index:  33.97 kg/m      Reproductive/Obstetrics                             Anesthesia Physical Anesthesia Plan  ASA: II  Anesthesia Plan: General   Post-op Pain Management:    Induction: Intravenous  PONV Risk Score and Plan: 3 and Treatment may vary due to age or medical condition and TIVA  Airway Management Planned: Nasal Cannula and Natural Airway  Additional Equipment:   Intra-op Plan:   Post-operative Plan:   Informed Consent: I have reviewed the patients History and Physical, chart, labs and discussed the procedure including the risks, benefits and alternatives for the proposed anesthesia with the patient or authorized representative who has indicated his/her understanding and acceptance.   Dental Advisory Given  Plan Discussed with: CRNA  Anesthesia Plan Comments:         Anesthesia Quick Evaluation

## 2018-01-08 LAB — SURGICAL PATHOLOGY

## 2018-01-10 ENCOUNTER — Telehealth: Payer: Self-pay

## 2018-01-10 NOTE — Telephone Encounter (Signed)
Notified patient as instructed, patient pleased. Discussed follow-up appointments, patient agrees. Patient placed in recalls. Scheduled for follow up here on 01/18/18.

## 2018-01-10 NOTE — Telephone Encounter (Signed)
-----   Message from Robert Bellow, MD sent at 01/10/2018 12:36 PM EDT ----- Please notify the patient all biopsies were OK.  Should have a f/u appointment to discuss removal of gallbladder. Place in recalls for a colonoscopy in five years.  Thanks.  ----- Message ----- From: Interface, Lab In Three [facility] One Sent: 01/08/2018  10:46 AM EDT To: Robert Bellow, MD

## 2018-01-10 NOTE — Telephone Encounter (Signed)
Message left for patient to call back for colonoscopy results.

## 2018-01-18 ENCOUNTER — Ambulatory Visit (INDEPENDENT_AMBULATORY_CARE_PROVIDER_SITE_OTHER): Payer: Medicare HMO | Admitting: General Surgery

## 2018-01-18 ENCOUNTER — Encounter: Payer: Self-pay | Admitting: General Surgery

## 2018-01-18 VITALS — BP 130/70 | HR 72 | Resp 13 | Ht <= 58 in | Wt 155.0 lb

## 2018-01-18 DIAGNOSIS — R194 Change in bowel habit: Secondary | ICD-10-CM | POA: Diagnosis not present

## 2018-01-18 DIAGNOSIS — K8020 Calculus of gallbladder without cholecystitis without obstruction: Secondary | ICD-10-CM | POA: Diagnosis not present

## 2018-01-18 NOTE — Progress Notes (Signed)
Patient ID: Mariah Hicks, female   DOB: 1945/07/02, 72 y.o.   MRN: [id number]  Chief Complaint  Patient presents with  . Follow-up    HPI Mariah Hicks is a 72 y.o. female here today to discuss her colonoscopy and upper done on 01/03/2018.  HPI  Past Medical History:  Diagnosis Date  . Headache    pinched nerve in neck.  Chiropracor monthly has relieved.  Mariah Hicks Heart disease   . Hypertension 2005  . Lump or mass in breast 2013   right breast fna done 03/22/12, negative  . Obesity, unspecified   . Palpitations   . Solitary cyst of breast 2014    Past Surgical History:  Procedure Laterality Date  . ABDOMINAL HYSTERECTOMY  1987  . ANKLE ARTHROSCOPY Right 07/20/2016   Procedure: ANKLE ARTHROSCOPY  debridement right;  Surgeon: Samara Deist, DPM;  Location[place];  Service: Podiatry;  Laterality: Right;  . APPENDECTOMY  1987  . BREAST BIOPSY Left 2010   neg  . COLONOSCOPY  2014   Dr Candace Cruise  . COLONOSCOPY WITH PROPOFOL N/A 01/03/2018   Procedure: COLONOSCOPY WITH PROPOFOL;  Surgeon: Robert Bellow, MD;  Location: [facility] ENDOSCOPY;  Service: Endoscopy;  Laterality: N/A;  . ESOPHAGOGASTRODUODENOSCOPY (EGD) WITH PROPOFOL N/A 01/03/2018   Procedure: ESOPHAGOGASTRODUODENOSCOPY (EGD) WITH PROPOFOL;  Surgeon: Robert Bellow, MD;  Location: [facility] ENDOSCOPY;  Service: Endoscopy;  Laterality: N/A;  . FOOT SURGERY Right 2012   ankle fracture/surgery/plate  . HARDWARE REMOVAL Right 07/20/2016   Procedure: HARDWARE REMOVAL;  Surgeon: Samara Deist, DPM;  Location: [place];  Service: Podiatry;  Laterality: Right;  8 hole plate removed 9 screws removed plate and screws removed intact  . MECKEL DIVERTICULUM EXCISION  1987   with abscess  . SHOULDER SURGERY Right 01/16/14  . TUBAL LIGATION    . UPPER GI ENDOSCOPY  2014   Dr Candace Cruise    Family History  Problem Relation Age of Onset  . Ovarian cancer Mother   . Breast cancer Mother   . Cancer Maternal Uncle        lung   . Cancer Maternal Grandmother        mouth  . Cancer Paternal Uncle        lung    Social History Social History   Tobacco Use  . Smoking status: Never Smoker  . Smokeless tobacco: Never Used  Substance Use Topics  . Alcohol use: No    Alcohol/week: 0.0 standard drinks  . Drug use: No    Allergies  Allergen Reactions  . Codeine Other (See Comments)    headache  . Penicillins Swelling  . Oxycodone Rash and Swelling    Current Outpatient Medications  Medication Sig Dispense Refill  . albuterol (PROVENTIL HFA;VENTOLIN HFA) 108 (90 BASE) MCG/ACT inhaler Inhale into the lungs every 6 (six) hours as needed for wheezing or shortness of breath.    . Calcium Carbonate-Vitamin D (CALCIUM + D PO) Take by mouth.    . cyclobenzaprine (FLEXERIL) 5 MG tablet Take 5 mg by mouth 3 (three) times daily as needed for muscle spasms.    Mariah Hicks estradiol (ESTRACE) 2 MG tablet Take 2 mg by mouth daily.    . fluticasone (FLONASE) 50 MCG/ACT nasal spray     . furosemide (LASIX) 40 MG tablet Take 40 mg by mouth daily.    Mariah Hicks HYDROcodone-acetaminophen (NORCO) 5-325 MG tablet Take 1-2 tablets by mouth every 6 (six) hours as needed for moderate pain.  30 tablet 0  . isosorbide mononitrate (IMDUR) 30 MG 24 hr tablet Take 30 mg by mouth daily.   11  . loratadine (CLARITIN) 10 MG tablet Take 10 mg by mouth daily.    Mariah Hicks losartan (COZAAR) 25 MG tablet Take 25 mg by mouth daily.    . meloxicam (MOBIC) 7.5 MG tablet Take 7.5 mg by mouth daily.    Mariah Hicks omeprazole (PRILOSEC) 40 MG capsule Take 40 mg by mouth daily.    . potassium chloride (K-DUR) 10 MEQ tablet     . traMADol (ULTRAM) 50 MG tablet Take 1 tablet (50 mg total) by mouth every 6 (six) hours as needed. 30 tablet 0   No current facility-administered medications for this visit.     Review of Systems Review of Systems  Constitutional: Negative.   Respiratory: Negative.   Cardiovascular: Negative.     Blood pressure 130/70, pulse 72, resp. rate 13, height  4\' 9"  (1.448 m), weight 155 lb (70.3 kg).  Physical Exam Physical Exam  Constitutional: She appears well-developed and well-nourished.  Cardiovascular: Normal rate and regular rhythm.  Pulmonary/Chest: Effort normal.    Data Reviewed January 03, 2018 endoscopic biopsies:  A. STOMACH POLYP, BODY; COLD BIOPSY:  - FUNDIC GLAND POLYP WITH FOCAL MODERATE CHRONIC ACTIVE GASTRITIS.  - IHC STAIN FOR H.PYLORI IS NEGATIVE.  - SEE COMMENT.  - NEGATIVE FOR DYSPLASIA AND MALIGNANCY.   B. DUODENUM, THIRD PORTION; COLD BIOPSY:  - UNREMARKABLE DUODENAL MUCOSA.  - SEPARATE FRAGMENT OF FUNDIC GLAND POLYP FROM STOMACH.  - NEGATIVE FOR DYSPLASIA AND MALIGNANCY.   C. COLON POLYP AND BLIND BIOPSIES, RIGHT; COLD BIOPSY:  - TUBULAR ADENOMA.  - COLONIC MUCOSA WITH LYMPHOID AGGREGATES.  - NEGATIVE FOR MICROSCOPIC COLITIS, DYSPLASIA, AND MALIGNANCY.   D. COLON, DESCENDING; COLD BIOPSY:  - COLONIC MUCOSA WITH FOCAL LYMPHOID AGGREGATES.  - NEGATIVE FOR MICROSCOPIC COLITIS, DYSPLASIA, AND MALIGNANCY.   December 20, 2017 abdominal ultrasound: IMPRESSION: 1. Cholelithiasis without evidence of cholecystitis. 2. Diffusely echogenic liver, most likely due to steatosis. 3. Incidental small liver cyst.  Assessment    Symptomatic cholelithiasis.    Plan      In light of f her endoscopic findings, clinical history of right upper quadrant pain accentuated by fatty foods and ultrasound findings, I think she will benefit from elective cholecystectomy.   Laparoscopic Cholecystectomy with Intraoperative Cholangiogram. The procedure, including it's potential risks and complications (including but not limited to infection, bleeding, injury to intra-abdominal organs or bile ducts, bile leak, poor cosmetic result, sepsis and death) were discussed with the patient in detail. Non-operative options, including their inherent risks (acute calculous cholecystitis with possible choledocholithiasis or gallstone pancreatitis,  with the risk of ascending cholangitis, sepsis, and death) were discussed as well. The patient expressed and understanding of what we discussed and wishes to proceed with laparoscopic cholecystectomy. The patient further understands that if it is technically not possible, or it is unsafe to proceed laparoscopically, that I will convert to an open cholecystectomy.    I have completed the exam and reviewed the above documentation for accuracy and completeness.  I agree with the above.  Haematologist has been used and any errors in dictation or transcription are unintentional.  [name], M.D., F.A.C.S.  The patient is scheduled for surgery at [facility] on 02/12/18. She will pre admit at the hospital. She is aware of date and instructions.  Documented by Caryl-Lyn Otis Brace LPN  [name] Tranika Scholler 01/18/2018, 9:21 PM

## 2018-01-18 NOTE — Patient Instructions (Addendum)
Return in five years colonoscopy.  Laparoscopic Cholecystectomy Laparoscopic cholecystectomy is surgery to remove the gallbladder. The gallbladder is a pear-shaped organ that lies beneath the liver on the right side of the body. The gallbladder stores bile, which is a fluid that helps the body to digest fats. Cholecystectomy is often done for inflammation of the gallbladder (cholecystitis). This condition is usually caused by a buildup of gallstones (cholelithiasis) in the gallbladder. Gallstones can block the flow of bile, which can result in inflammation and pain. In severe cases, emergency surgery may be required. This procedure is done though small incisions in your abdomen (laparoscopic surgery). A thin scope with a camera (laparoscope) is inserted through one incision. Thin surgical instruments are inserted through the other incisions. In some cases, a laparoscopic procedure may be turned into a type of surgery that is done through a larger incision (open surgery). Tell a health care provider about:  Any allergies you have.  All medicines you are taking, including vitamins, herbs, eye drops, creams, and over-the-counter medicines.  Any problems you or family members have had with anesthetic medicines.  Any blood disorders you have.  Any surgeries you have had.  Any medical conditions you have.  Whether you are pregnant or may be pregnant. What are the risks? Generally, this is a safe procedure. However, problems may occur, including:  Infection.  Bleeding.  Allergic reactions to medicines.  Damage to other structures or organs.  A stone remaining in the common bile duct. The common bile duct carries bile from the gallbladder into the small intestine.  A bile leak from the cyst duct that is clipped when your gallbladder is removed.  What happens before the procedure? Staying hydrated Follow instructions from your health care provider about hydration, which may include:  Up  to 2 hours before the procedure - you may continue to drink clear liquids, such as water, clear fruit juice, black coffee, and plain tea.  Eating and drinking restrictions Follow instructions from your health care provider about eating and drinking, which may include:  8 hours before the procedure - stop eating heavy meals or foods such as meat, fried foods, or fatty foods.  6 hours before the procedure - stop eating light meals or foods, such as toast or cereal.  6 hours before the procedure - stop drinking milk or drinks that contain milk.  2 hours before the procedure - stop drinking clear liquids.  Medicines  Ask your health care provider about: ? Changing or stopping your regular medicines. This is especially important if you are taking diabetes medicines or blood thinners. ? Taking medicines such as aspirin and ibuprofen. These medicines can thin your blood. Do not take these medicines before your procedure if your health care provider instructs you not to.  You may be given antibiotic medicine to help prevent infection. General instructions  Let your health care provider know if you develop a cold or an infection before surgery.  Plan to have someone take you home from the hospital or clinic.  Ask your health care provider how your surgical site will be marked or identified. What happens during the procedure?  To reduce your risk of infection: ? Your health care team will wash or sanitize their hands. ? Your skin will be washed with soap. ? Hair may be removed from the surgical area.  An IV tube may be inserted into one of your veins.  You will be given one or more of the following: ? A  medicine to help you relax (sedative). ? A medicine to make you fall asleep (general anesthetic).  A breathing tube will be placed in your mouth.  Your surgeon will make several small cuts (incisions) in your abdomen.  The laparoscope will be inserted through one of the small  incisions. The camera on the laparoscope will send images to a TV screen (monitor) in the operating room. This lets your surgeon see inside your abdomen.  Air-like gas will be pumped into your abdomen. This will expand your abdomen to give the surgeon more room to perform the surgery.  Other tools that are needed for the procedure will be inserted through the other incisions. The gallbladder will be removed through one of the incisions.  Your common bile duct may be examined. If stones are found in the common bile duct, they may be removed.  After your gallbladder has been removed, the incisions will be closed with stitches (sutures), staples, or skin glue.  Your incisions may be covered with a bandage (dressing). The procedure may vary among health care providers and hospitals. What happens after the procedure?  Your blood pressure, heart rate, breathing rate, and blood oxygen level will be monitored until the medicines you were given have worn off.  You will be given medicines as needed to control your pain.  Do not drive for 24 hours if you were given a sedative. This information is not intended to replace advice given to you by your health care provider. Make sure you discuss any questions you have with your health care provider. Document Released: 05/16/2005 Document Revised: 12/06/2015 Document Reviewed: 11/02/2015 Elsevier Interactive Patient Education  [employer].   The patient is scheduled for surgery at [facility] on 02/12/18. She will pre admit at the hospital. She is aware of date and instructions.

## 2018-01-19 ENCOUNTER — Telehealth: Payer: Self-pay

## 2018-01-19 NOTE — Telephone Encounter (Signed)
Message left for patient to call for her pre admit arrival time and date. She will report to the Pre admission department in the [facility] on 02/05/18 at 10:30 am.

## 2018-01-19 NOTE — Telephone Encounter (Signed)
Patient notified of date and time.  

## 2018-02-05 ENCOUNTER — Encounter
Admission: RE | Admit: 2018-02-05 | Discharge: 2018-02-05 | Disposition: A | Discharge status: Home / Self Care | Payer: Medicare HMO | Source: Ambulatory Visit | Attending: General Surgery | Admitting: General Surgery

## 2018-02-05 ENCOUNTER — Other Ambulatory Visit: Payer: Self-pay

## 2018-02-05 DIAGNOSIS — Z01812 Encounter for preprocedural laboratory examination: Secondary | ICD-10-CM | POA: Insufficient documentation

## 2018-02-05 HISTORY — DX: Other seasonal allergic rhinitis: J30.2

## 2018-02-05 LAB — CBC
HEMATOCRIT: 36 % (ref 35.0–47.0)
HEMOGLOBIN: 12.3 g/dL (ref 12.0–16.0)
MCH: 28.6 pg (ref 26.0–34.0)
MCHC: 34.2 g/dL (ref 32.0–36.0)
MCV: 83.7 fL (ref 80.0–100.0)
Platelets: 278 10*3/uL (ref 150–440)
RBC: 4.3 MIL/uL (ref 3.80–5.20)
RDW: 15 % — ABNORMAL HIGH (ref 11.5–14.5)
WBC: 7.5 10*3/uL (ref 3.6–11.0)

## 2018-02-05 LAB — BASIC METABOLIC PANEL
ANION GAP: 7 (ref 5–15)
BUN: 12 mg/dL (ref 8–23)
CHLORIDE: 103 mmol/L (ref 98–111)
CO2: 27 mmol/L (ref 22–32)
Calcium: 8.9 mg/dL (ref 8.9–10.3)
Creatinine, Ser: 0.77 mg/dL (ref 0.44–1.00)
GFR calc non Af Amer: >60 mL/min (ref >60)
Glucose, Bld: 91 mg/dL (ref 70–99)
POTASSIUM: 4.4 mmol/L (ref 3.5–5.1)
Sodium: 137 mmol/L (ref 135–145)

## 2018-02-05 NOTE — Patient Instructions (Signed)
Your procedure is scheduled on: Monday, February 12, 2018 Report to Day Surgery on the 2nd floor of the [facility]. To find out your arrival time, please call [phone] between 1PM - 3PM on: Friday, February 09, 2018  REMEMBER: Instructions that are not followed completely may result in serious medical risk, up to and including death; or upon the discretion of your surgeon and anesthesiologist your surgery may need to be rescheduled.  Do not eat food after midnight the night before surgery.  No gum chewing, lozengers or hard candies.  You may however, drink CLEAR liquids up to 2 hours before you are scheduled to arrive for your surgery. Do not drink anything within 2 hours of the start of your surgery.  Clear liquids include: - water  - apple juice without pulp - gatorade - black coffee or tea (Do NOT add milk or creamers to the coffee or tea) Do NOT drink anything that is not on this list.  No Alcohol for 24 hours before or after surgery.  No Smoking including e-cigarettes for 24 hours prior to surgery.  No chewable tobacco products for at least 6 hours prior to surgery.  No nicotine patches on the day of surgery.  On the morning of surgery brush your teeth with toothpaste and water, you may rinse your mouth with mouthwash if you wish. Do not swallow any toothpaste or mouthwash.  Notify your doctor if there is any change in your medical condition (cold, fever, infection).  Do not wear jewelry, make-up, hairpins, clips or nail polish.  Do not wear lotions, powders, or perfumes. You may wear deodorant.  Do not shave 48 hours prior to surgery.   Contacts and dentures may not be worn into surgery.  Do not bring valuables to the hospital, including drivers license, insurance or credit cards.  [facility] is not responsible for any belongings or valuables.   TAKE THESE MEDICATIONS THE MORNING OF SURGERY:  1.  Albuterol inhaler 2.  Omeprazole (take one the night before  surgery and one the morning of surgery - helps to prevent nausea after surgery) 3.  Hydrocodone (if needed for pain)  Use CHG Soap as directed on instruction sheet.  Use inhalers on the day of surgery and bring to the hospital.   NOW!  Stop MELOXICAM, Anti-inflammatories (NSAIDS) such as Advil, Aleve, Ibuprofen, Motrin, Naproxen, Naprosyn and Aspirin based products such as Excedrin, Goodys Powder, BC Powder. (May take Tylenol or Acetaminophen if needed.)  NOW!  Stop ANY OVER THE COUNTER supplements until after surgery. (May continue Vitamin D.)  Wear comfortable clothing (specific to your surgery type) to the hospital.  If you are being discharged the day of surgery, you will not be allowed to drive home. You will need a responsible adult to drive you home and stay with you that night.   If you are taking public transportation, you will need to have a responsible adult with you. Please confirm with your physician that it is acceptable to use public transportation.   Please call [phone] if you have any questions about these instructions.

## 2018-02-11 MED ORDER — CEFAZOLIN SODIUM-DEXTROSE 2-4 GM/100ML-% IV SOLN
2.0000 g | INTRAVENOUS | Status: AC
  Administered 2018-02-12: 2 g via INTRAVENOUS

## 2018-02-12 ENCOUNTER — Ambulatory Visit: Payer: Medicare HMO | Admitting: Anesthesiology

## 2018-02-12 ENCOUNTER — Other Ambulatory Visit: Payer: Self-pay

## 2018-02-12 ENCOUNTER — Ambulatory Visit: Payer: Medicare HMO

## 2018-02-12 ENCOUNTER — Encounter: Payer: Self-pay | Admitting: General Surgery

## 2018-02-12 ENCOUNTER — Ambulatory Visit
Admission: RE | Admit: 2018-02-12 | Discharge: 2018-02-12 | Disposition: A | Discharge status: Home / Self Care | Payer: Medicare HMO | Source: Ambulatory Visit | Attending: General Surgery | Admitting: General Surgery

## 2018-02-12 ENCOUNTER — Encounter
Admission: RE | Disposition: A | Discharge status: Home / Self Care | Payer: Self-pay | Source: Ambulatory Visit | Attending: General Surgery

## 2018-02-12 DIAGNOSIS — K219 Gastro-esophageal reflux disease without esophagitis: Secondary | ICD-10-CM | POA: Diagnosis not present

## 2018-02-12 DIAGNOSIS — Z6833 Body mass index (BMI) 33.0-33.9, adult: Secondary | ICD-10-CM | POA: Diagnosis not present

## 2018-02-12 DIAGNOSIS — I10 Essential (primary) hypertension: Secondary | ICD-10-CM | POA: Insufficient documentation

## 2018-02-12 DIAGNOSIS — E669 Obesity, unspecified: Secondary | ICD-10-CM | POA: Diagnosis not present

## 2018-02-12 DIAGNOSIS — J45909 Unspecified asthma, uncomplicated: Secondary | ICD-10-CM | POA: Diagnosis not present

## 2018-02-12 DIAGNOSIS — Z880 Allergy status to penicillin: Secondary | ICD-10-CM | POA: Insufficient documentation

## 2018-02-12 DIAGNOSIS — K8010 Calculus of gallbladder with chronic cholecystitis without obstruction: Secondary | ICD-10-CM | POA: Insufficient documentation

## 2018-02-12 DIAGNOSIS — K8020 Calculus of gallbladder without cholecystitis without obstruction: Secondary | ICD-10-CM

## 2018-02-12 DIAGNOSIS — Z885 Allergy status to narcotic agent status: Secondary | ICD-10-CM | POA: Insufficient documentation

## 2018-02-12 DIAGNOSIS — Z79899 Other long term (current) drug therapy: Secondary | ICD-10-CM | POA: Insufficient documentation

## 2018-02-12 DIAGNOSIS — Z419 Encounter for procedure for purposes other than remedying health state, unspecified: Secondary | ICD-10-CM

## 2018-02-12 HISTORY — PX: CHOLECYSTECTOMY: SHX55

## 2018-02-12 SURGERY — LAPAROSCOPIC CHOLECYSTECTOMY WITH INTRAOPERATIVE CHOLANGIOGRAM
Anesthesia: General | Site: Abdomen

## 2018-02-12 MED ORDER — SUGAMMADEX SODIUM 200 MG/2ML IV SOLN
INTRAVENOUS | Status: DC | PRN
  Administered 2018-02-12: 150 mg via INTRAVENOUS

## 2018-02-12 MED ORDER — MIDAZOLAM HCL 2 MG/2ML IJ SOLN
INTRAMUSCULAR | Status: DC | PRN
  Administered 2018-02-12: 2 mg via INTRAVENOUS

## 2018-02-12 MED ORDER — HYDROCODONE-ACETAMINOPHEN 5-325 MG PO TABS: 1.0000 | ORAL_TABLET | ORAL | 0 refills | Status: DC | PRN

## 2018-02-12 MED ORDER — ROCURONIUM BROMIDE 50 MG/5ML IV SOLN
INTRAVENOUS | Status: AC
  Filled 2018-02-12: qty 1

## 2018-02-12 MED ORDER — SUGAMMADEX SODIUM 200 MG/2ML IV SOLN
INTRAVENOUS | Status: AC
  Filled 2018-02-12: qty 2

## 2018-02-12 MED ORDER — PROPOFOL 10 MG/ML IV BOLUS
INTRAVENOUS | Status: DC | PRN
  Administered 2018-02-12: 120 mg via INTRAVENOUS

## 2018-02-12 MED ORDER — LACTATED RINGERS IV SOLN
INTRAVENOUS | Status: DC
  Administered 2018-02-12: 08:00:00 via INTRAVENOUS

## 2018-02-12 MED ORDER — FENTANYL CITRATE (PF) 100 MCG/2ML IJ SOLN
25.0000 ug | INTRAMUSCULAR | Status: DC | PRN
  Administered 2018-02-12 (×2): 25 ug via INTRAVENOUS

## 2018-02-12 MED ORDER — MIDAZOLAM HCL 2 MG/2ML IJ SOLN
INTRAMUSCULAR | Status: AC
  Filled 2018-02-12: qty 2

## 2018-02-12 MED ORDER — FENTANYL CITRATE (PF) 100 MCG/2ML IJ SOLN
INTRAMUSCULAR | Status: AC
  Filled 2018-02-12: qty 2

## 2018-02-12 MED ORDER — FAMOTIDINE 20 MG PO TABS
ORAL_TABLET | ORAL | Status: AC
  Filled 2018-02-12: qty 1

## 2018-02-12 MED ORDER — ACETAMINOPHEN 10 MG/ML IV SOLN
INTRAVENOUS | Status: DC | PRN
  Administered 2018-02-12: 1000 mg via INTRAVENOUS

## 2018-02-12 MED ORDER — ONDANSETRON HCL 4 MG/2ML IJ SOLN: 4.0000 mg | Freq: Once | INTRAMUSCULAR | Status: DC | PRN

## 2018-02-12 MED ORDER — KETOROLAC TROMETHAMINE 30 MG/ML IJ SOLN
INTRAMUSCULAR | Status: DC | PRN
  Administered 2018-02-12: 15 mg via INTRAVENOUS

## 2018-02-12 MED ORDER — FENTANYL CITRATE (PF) 100 MCG/2ML IJ SOLN
INTRAMUSCULAR | Status: AC
  Administered 2018-02-12: 25 ug via INTRAVENOUS
  Filled 2018-02-12: qty 2

## 2018-02-12 MED ORDER — ONDANSETRON HCL 4 MG/2ML IJ SOLN
INTRAMUSCULAR | Status: AC
  Filled 2018-02-12: qty 2

## 2018-02-12 MED ORDER — PROPOFOL 10 MG/ML IV BOLUS
INTRAVENOUS | Status: AC
  Filled 2018-02-12: qty 20

## 2018-02-12 MED ORDER — FENTANYL CITRATE (PF) 100 MCG/2ML IJ SOLN
INTRAMUSCULAR | Status: DC | PRN
  Administered 2018-02-12 (×2): 50 ug via INTRAVENOUS

## 2018-02-12 MED ORDER — CEFAZOLIN SODIUM-DEXTROSE 2-4 GM/100ML-% IV SOLN
INTRAVENOUS | Status: AC
  Filled 2018-02-12: qty 100

## 2018-02-12 MED ORDER — SODIUM CHLORIDE 0.9 % IV SOLN
INTRAVENOUS | Status: DC | PRN
  Administered 2018-02-12: 6 mL

## 2018-02-12 MED ORDER — KETOROLAC TROMETHAMINE 30 MG/ML IJ SOLN
INTRAMUSCULAR | Status: AC
  Filled 2018-02-12: qty 1

## 2018-02-12 MED ORDER — LIDOCAINE HCL (PF) 2 % IJ SOLN
INTRAMUSCULAR | Status: AC
  Filled 2018-02-12: qty 10

## 2018-02-12 MED ORDER — DEXAMETHASONE SODIUM PHOSPHATE 10 MG/ML IJ SOLN
INTRAMUSCULAR | Status: DC | PRN
  Administered 2018-02-12: 10 mg via INTRAVENOUS

## 2018-02-12 MED ORDER — LIDOCAINE HCL (CARDIAC) PF 100 MG/5ML IV SOSY
PREFILLED_SYRINGE | INTRAVENOUS | Status: DC | PRN
  Administered 2018-02-12: 80 mg via INTRAVENOUS

## 2018-02-12 MED ORDER — EPHEDRINE SULFATE 50 MG/ML IJ SOLN
INTRAMUSCULAR | Status: AC
  Filled 2018-02-12: qty 1

## 2018-02-12 MED ORDER — ROCURONIUM BROMIDE 100 MG/10ML IV SOLN
INTRAVENOUS | Status: DC | PRN
  Administered 2018-02-12: 40 mg via INTRAVENOUS

## 2018-02-12 MED ORDER — DEXAMETHASONE SODIUM PHOSPHATE 10 MG/ML IJ SOLN
INTRAMUSCULAR | Status: AC
  Filled 2018-02-12: qty 1

## 2018-02-12 MED ORDER — ACETAMINOPHEN 10 MG/ML IV SOLN
INTRAVENOUS | Status: AC
  Filled 2018-02-12: qty 100

## 2018-02-12 SURGICAL SUPPLY — 40 items
APPLIER CLIP ROT 10 11.4 M/L (STAPLE) ×3
BLADE SURG 11 STRL SS SAFETY (MISCELLANEOUS) ×3 IMPLANT
CANISTER SUCT 1200ML W/VALVE (MISCELLANEOUS) ×3 IMPLANT
CANNULA DILATOR  5MM W/SLV (CANNULA) ×2
CANNULA DILATOR 10 W/SLV (CANNULA) ×2 IMPLANT
CANNULA DILATOR 10MM W/SLV (CANNULA) ×1
CANNULA DILATOR 5 W/SLV (CANNULA) ×4 IMPLANT
CATH CHOLANG 76X19 KUMAR (CATHETERS) ×3 IMPLANT
CHLORAPREP W/TINT 26ML (MISCELLANEOUS) ×3 IMPLANT
CLIP APPLIE ROT 10 11.4 M/L (STAPLE) ×1 IMPLANT
CLOSURE WOUND 1/2 X4 (GAUZE/BANDAGES/DRESSINGS) ×1
CONRAY 60ML FOR OR (MISCELLANEOUS) ×3 IMPLANT
DISSECTOR KITTNER STICK (MISCELLANEOUS) ×1 IMPLANT
DISSECTORS/KITTNER STICK (MISCELLANEOUS) ×3
DRAPE SHEET LG 3/4 BI-LAMINATE (DRAPES) ×3 IMPLANT
DRSG TEGADERM 2-3/8X2-3/4 SM (GAUZE/BANDAGES/DRESSINGS) ×12 IMPLANT
DRSG TELFA 4X3 1S NADH ST (GAUZE/BANDAGES/DRESSINGS) ×3 IMPLANT
ELECT REM PT RETURN 9FT ADLT (ELECTROSURGICAL) ×3
ELECTRODE REM PT RTRN 9FT ADLT (ELECTROSURGICAL) ×1 IMPLANT
GLOVE BIO SURGEON STRL SZ7.5 (GLOVE) ×3 IMPLANT
GLOVE INDICATOR 8.0 STRL GRN (GLOVE) ×3 IMPLANT
GOWN STRL REUS W/ TWL LRG LVL3 (GOWN DISPOSABLE) ×3 IMPLANT
GOWN STRL REUS W/TWL LRG LVL3 (GOWN DISPOSABLE) ×6
IRRIGATION STRYKERFLOW (MISCELLANEOUS) ×1 IMPLANT
IRRIGATOR STRYKERFLOW (MISCELLANEOUS) ×3
IV LACTATED RINGERS 1000ML (IV SOLUTION) ×3 IMPLANT
KIT TURNOVER KIT A (KITS) ×3 IMPLANT
LABEL OR SOLS (LABEL) ×3 IMPLANT
NDL INSUFF ACCESS 14 VERSASTEP (NEEDLE) ×3 IMPLANT
NS IRRIG 500ML POUR BTL (IV SOLUTION) ×3 IMPLANT
PACK LAP CHOLECYSTECTOMY (MISCELLANEOUS) ×3 IMPLANT
POUCH SPECIMEN RETRIEVAL 10MM (ENDOMECHANICALS) IMPLANT
SCISSORS METZENBAUM CVD 33 (INSTRUMENTS) ×3 IMPLANT
STRIP CLOSURE SKIN 1/2X4 (GAUZE/BANDAGES/DRESSINGS) ×2 IMPLANT
SUT VIC AB 0 CT2 27 (SUTURE) ×3 IMPLANT
SUT VIC AB 4-0 FS2 27 (SUTURE) ×3 IMPLANT
SWABSTK COMLB BENZOIN TINCTURE (MISCELLANEOUS) ×3 IMPLANT
TROCAR XCEL NON-BLD 11X100MML (ENDOMECHANICALS) ×3 IMPLANT
TUBING INSUFFLATION (TUBING) ×3 IMPLANT
WATER STERILE IRR 1000ML POUR (IV SOLUTION) ×3 IMPLANT

## 2018-02-12 NOTE — Anesthesia Postprocedure Evaluation (Signed)
Anesthesia Post Note  Patient: Mariah Hicks  Procedure(s) Performed: LAPAROSCOPIC CHOLECYSTECTOMY WITH INTRAOPERATIVE CHOLANGIOGRAM (N/A Abdomen)  Patient location during evaluation: PACU Anesthesia Type: General Level of consciousness: awake and alert Pain management: pain level controlled Vital Signs Assessment: post-procedure vital signs reviewed and stable Respiratory status: spontaneous breathing and respiratory function stable Cardiovascular status: stable Anesthetic complications: no     Last Vitals:  Vitals:   02/12/18 0756 02/12/18 1044  BP: 133/77 (!) 129/52  Pulse: 74 82  Resp: 17   Temp: (!) 35.7 C 37.3 C  SpO2: 100% 100%    Last Pain:  Vitals:   02/12/18 1044  TempSrc: Temporal  PainSc:                  Tachina Spoonemore K

## 2018-02-12 NOTE — Anesthesia Post-op Follow-up Note (Signed)
Anesthesia QCDR form completed.        

## 2018-02-12 NOTE — Anesthesia Preprocedure Evaluation (Signed)
Anesthesia Evaluation  Patient identified by MRN, date of birth, ID band Patient awake    Reviewed: Allergy & Precautions, NPO status , Patient's Chart, lab work & pertinent test results  History of Anesthesia Complications Negative for: history of anesthetic complications  Airway Mallampati: III       Dental   Pulmonary asthma (allergies) , neg sleep apnea, neg COPD,           Cardiovascular hypertension, Pt. on medications (-) Past MI and (-) CHF + dysrhythmias (afib occassionally) Atrial Fibrillation (-) Valvular Problems/Murmurs     Neuro/Psych neg Seizures    GI/Hepatic Neg liver ROS, GERD  Medicated and Controlled,  Endo/Other  neg diabetes  Renal/GU negative Renal ROS     Musculoskeletal   Abdominal   Peds  Hematology   Anesthesia Other Findings   Reproductive/Obstetrics                             Anesthesia Physical Anesthesia Plan  ASA: III  Anesthesia Plan: General   Post-op Pain Management:    Induction:   PONV Risk Score and Plan: 3 and Ondansetron, Dexamethasone, Treatment may vary due to age or medical condition and Midazolam  Airway Management Planned: Oral ETT  Additional Equipment:   Intra-op Plan:   Post-operative Plan:   Informed Consent: I have reviewed the patients History and Physical, chart, labs and discussed the procedure including the risks, benefits and alternatives for the proposed anesthesia with the patient or authorized representative who has indicated his/her understanding and acceptance.     Plan Discussed with:   Anesthesia Plan Comments:         Anesthesia Quick Evaluation

## 2018-02-12 NOTE — Transfer of Care (Signed)
Immediate Anesthesia Transfer of Care Note  Patient: Mariah Hicks  Procedure(s) Performed: Procedure(s): LAPAROSCOPIC CHOLECYSTECTOMY WITH INTRAOPERATIVE CHOLANGIOGRAM (N/A)  Patient Location: PACU  Anesthesia Type:General  Level of Consciousness: sedated  Airway & Oxygen Therapy: Patient Spontanous Breathing and Patient connected to face mask oxygen  Post-op Assessment: Report given to RN and Post -op Vital signs reviewed and stable  Post vital signs: Reviewed and stable  Last Vitals:  Vitals:   02/12/18 0756 02/12/18 1044  BP: 133/77 (!) 129/52  Pulse: 74 82  Resp: 17   Temp: (!) 35.7 C 37.3 C  SpO2: 151% 834%    Complications: No apparent anesthesia complications

## 2018-02-12 NOTE — Op Note (Signed)
Preoperative diagnosis: Chronic cholecystitis and cholelithiasis.  Postoperative diagnosis: Same.  Operative procedure: Laparoscopic cholecystectomy with intraoperative cholangiograms.  Operating Surgeon: [name], MD.  Anesthesia: General endotracheal.  Estimated blood loss: Less than 5 cc.  Clinical note: This 72 year old woman is had right upper quadrant pain with fatty meals and cholelithiasis on ultrasound.  Upper and lower endoscopy were noncontributory.  She is brought to the operative for planned cholecystectomy.  She received Kefzol prior to the procedure.  Operative note: With the patient under adequate general endotracheal anesthesia the abdomen was cleansed with ChloraPrep and draped.  A large amount of cerumen-like material was removed from the umbilical area prior to incision.  A trans-umbilical incision was made with the patient in Trendelenburg position and a varies needle passed.  After assuring intra-abdominal location with a hanging drop test the abdomen was insufflated with CO2 at 10 mmHg pressure.  A 10 mm Step port was expanded and inspection showed no evidence of injury from initial port placement.  The patient was placed in reverse Trendelenburg position and rolled to the left.  An 11 mm XL port was placed in the epigastrium and 2-5 mm Step ports were placed in the right lateral abdominal wall under direct vision.  The gallbladder was noted to have changes consistent with chronic cholecystitis.  Minimal adhesions near the neck of the gallbladder.  The cystic duct was isolated.  A Kumar clamp was placed and fluoroscopic cholangiograms were completed using 6 cc of one half strength Conray 60.  This showed prompt filling of the long cystic duct, reflection of the common hepatic duct and free flow into the duodenum.  The cystic duct and cystic artery were doubly clipped and divided.  The gallbladder was removed from the liver bed making use of a cautery dissection.  It was  delivered into an Endo Catch bag and then through the umbilical port site.  Reestablishing pneumoperitoneum allowed inspection of the lower abdomen from the epigastric site.  No evidence of injury from initial port placement.  The right upper quadrant was irrigated with lactated Ringer solution.  Good hemostasis was noted.  Clips as noted above.  The abdomen was then desufflated and ports removed under direct vision.  The umbilical fascia was approximated with a single 0 Vicryl suture.  Skin incisions were closed with 4-0 Vicryl subcuticular sutures.  Benzoin, Steri-Strips, Telfa and Tegaderm dressings were applied.  The patient tolerated the procedure well and was taken to recovery room in stable condition.

## 2018-02-12 NOTE — H&P (Signed)
Mariah Hicks [id number] [birth year]     HPI:   No change in clinical history or exam.   Medications Prior to Admission  Medication Sig Dispense Refill Last Dose  . albuterol (PROVENTIL HFA;VENTOLIN HFA) 108 (90 BASE) MCG/ACT inhaler Inhale into the lungs every 6 (six) hours as needed for wheezing or shortness of breath.   02/12/2018 at Unknown time  . cyclobenzaprine (FLEXERIL) 5 MG tablet Take 5 mg by mouth 3 (three) times daily as needed for muscle spasms.   02/11/2018 at Unknown time  . ergocalciferol (VITAMIN D2) 50000 units capsule Take 50,000 Units by mouth once a week.   02/11/2018 at Unknown time  . estradiol (ESTRACE) 1 MG tablet Take 1 mg by mouth daily.   02/11/2018 at Unknown time  . fluticasone (FLONASE) 50 MCG/ACT nasal spray Place 2 sprays into both nostrils daily as needed.    02/11/2018 at Unknown time  . furosemide (LASIX) 40 MG tablet Take 40 mg by mouth 2 (two) times daily.    02/11/2018 at Unknown time  . isosorbide mononitrate (IMDUR) 30 MG 24 hr tablet Take 15 mg by mouth daily as needed.   11 02/11/2018 at Unknown time  . loratadine (CLARITIN) 10 MG tablet Take 10 mg by mouth daily.   02/11/2018 at Unknown time  . losartan (COZAAR) 25 MG tablet Take 25 mg by mouth daily.   02/11/2018 at Unknown time  . meloxicam (MOBIC) 7.5 MG tablet Take 7.5 mg by mouth daily as needed.    02/11/2018 at Unknown time  . omeprazole (PRILOSEC) 40 MG capsule Take 40 mg by mouth daily.   02/12/2018 at Unknown time  . potassium chloride (K-DUR) 10 MEQ tablet Take 10 mEq by mouth daily.    02/11/2018 at Unknown time  . traMADol (ULTRAM) 50 MG tablet Take by mouth every 6 (six) hours as needed.   Past Month at Unknown time  . HYDROcodone-acetaminophen (NORCO) 5-325 MG tablet Take 1-2 tablets by mouth every 6 (six) hours as needed for moderate pain. (Patient not taking: Reported on 02/12/2018) 30 tablet 0 Not Taking at Unknown time   Allergies  Allergen Reactions  . Codeine Other (See Comments)   headache  . Penicillins Swelling  . Oxycodone Rash and Swelling   Past Medical History:  Diagnosis Date  . Headache    pinched nerve in neck.  Chiropracor monthly has relieved.  [name] Heart disease   . Hypertension 2005  . Lump or mass in breast 2013   right breast fna done 03/22/12, negative  . Obesity, unspecified   . Palpitations   . Seasonal allergies   . Solitary cyst of breast 2014   Past Surgical History:  Procedure Laterality Date  . ABDOMINAL HYSTERECTOMY  1987  . ANKLE ARTHROSCOPY Right 07/20/2016   Procedure: ANKLE ARTHROSCOPY  debridement right;  Surgeon: Samara Deist, DPM;  Location: [facility];  Service: Podiatry;  Laterality: Right;  . APPENDECTOMY  1987  . BREAST BIOPSY Left 2010   neg  . COLONOSCOPY  2014   Dr Candace Cruise  . COLONOSCOPY WITH PROPOFOL N/A 01/03/2018   Procedure: COLONOSCOPY WITH PROPOFOL;  Surgeon: Robert Bellow, MD;  Location: [facility] ENDOSCOPY;  Service: Endoscopy;  Laterality: N/A;  . ESOPHAGOGASTRODUODENOSCOPY (EGD) WITH PROPOFOL N/A 01/03/2018   Procedure: ESOPHAGOGASTRODUODENOSCOPY (EGD) WITH PROPOFOL;  Surgeon: Robert Bellow, MD;  Location: [facility] ENDOSCOPY;  Service: Endoscopy;  Laterality: N/A;  . FOOT SURGERY Right 2012   ankle fracture/surgery/plate  . HARDWARE REMOVAL Right  07/20/2016   Procedure: HARDWARE REMOVAL;  Surgeon: Samara Deist, DPM;  Location: [facility];  Service: Podiatry;  Laterality: Right;  8 hole plate removed 9 screws removed plate and screws removed intact  . MECKEL DIVERTICULUM EXCISION  1987   with abscess  . SHOULDER SURGERY Right 01/16/14  . TUBAL LIGATION    . UPPER GI ENDOSCOPY  2014   Dr Candace Cruise   Social History   Socioeconomic History  . Marital status: Married    Spouse name: Not on file  . Number of children: Not on file  . Years of education: Not on file  . Highest education level: Not on file  Occupational History  . Not on file  Social Needs  . Financial resource strain: Not on file  .  Food insecurity:    Worry: Not on file    Inability: Not on file  . Transportation needs:    Medical: Not on file    Non-medical: Not on file  Tobacco Use  . Smoking status: Never Smoker  . Smokeless tobacco: Never Used  Substance and Sexual Activity  . Alcohol use: No    Alcohol/week: 0.0 standard drinks  . Drug use: No  . Sexual activity: Not on file  Lifestyle  . Physical activity:    Days per week: Not on file    Minutes per session: Not on file  . Stress: Not on file  Relationships  . Social connections:    Talks on phone: Not on file    Gets together: Not on file    Attends religious service: Not on file    Active member of club or organization: Not on file    Attends meetings of clubs or organizations: Not on file    Relationship status: Not on file  . Intimate partner violence:    Fear of current or ex partner: Not on file    Emotionally abused: Not on file    Physically abused: Not on file    Forced sexual activity: Not on file  Other Topics Concern  . Not on file  Social History Narrative  . Not on file   Social History   Social History Narrative  . Not on file     ROS: Negative.     PE: HEENT: Negative. Lungs: Clear. Cardio: RR.  Assessment/Plan:  Proceed with planned cholecystectomy.   [name] [facility] 02/12/2018

## 2018-02-12 NOTE — Anesthesia Procedure Notes (Signed)
Procedure Name: Intubation Date/Time: 02/12/2018 9:47 AM Performed by: Doreen Salvage, CRNA Pre-anesthesia Checklist: Patient identified, Patient being monitored, Timeout performed, Emergency Drugs available and Suction available Patient Re-evaluated:Patient Re-evaluated prior to induction Oxygen Delivery Method: Circle system utilized Preoxygenation: Pre-oxygenation with 100% oxygen Induction Type: IV induction Ventilation: Mask ventilation without difficulty Laryngoscope Size: Mac and 3 Grade View: Grade I Tube type: Oral Tube size: 7.0 mm Number of attempts: 1 Airway Equipment and Method: Stylet Placement Confirmation: ETT inserted through vocal cords under direct vision,  positive ETCO2 and breath sounds checked- equal and bilateral Secured at: 19 cm Tube secured with: Tape Dental Injury: Teeth and Oropharynx as per pre-operative assessment

## 2018-02-12 NOTE — Discharge Instructions (Signed)

## 2018-02-15 LAB — SURGICAL PATHOLOGY

## 2018-02-27 ENCOUNTER — Ambulatory Visit (INDEPENDENT_AMBULATORY_CARE_PROVIDER_SITE_OTHER): Payer: Medicare HMO | Admitting: General Surgery

## 2018-02-27 VITALS — BP 122/76 | HR 74 | Ht <= 58 in | Wt 153.0 lb

## 2018-02-27 DIAGNOSIS — K8020 Calculus of gallbladder without cholecystitis without obstruction: Secondary | ICD-10-CM

## 2018-02-27 NOTE — Patient Instructions (Signed)
Try low fat foods. Return as needed. The patient is aware to call back for any questions or concerns.

## 2018-02-27 NOTE — Progress Notes (Signed)
Patient ID: Mariah Hicks, female   DOB: 08/26/1945, 72 y.o.   MRN: [id number]  Chief Complaint  Patient presents with  . Routine Post Op    HPI Mariah Hicks is a 72 y.o. female here today for her post op gallbladder done on 02/12/2018. Patient states she is doing well. Moves her bowels right after she eatrs.  HPI  Past Medical History:  Diagnosis Date  . Headache    pinched nerve in neck.  Chiropracor monthly has relieved.  [name] Heart disease   . Hypertension 2005  . Lump or mass in breast 2013   right breast fna done 03/22/12, negative  . Obesity, unspecified   . Palpitations   . Seasonal allergies   . Solitary cyst of breast 2014    Past Surgical History:  Procedure Laterality Date  . ABDOMINAL HYSTERECTOMY  1987  . ANKLE ARTHROSCOPY Right 07/20/2016   Procedure: ANKLE ARTHROSCOPY  debridement right;  Surgeon: Samara Deist, DPM;  Location: [place];  Service: Podiatry;  Laterality: Right;  . APPENDECTOMY  1987  . BREAST BIOPSY Left 2010   neg  . CHOLECYSTECTOMY N/A 02/12/2018   Procedure: LAPAROSCOPIC CHOLECYSTECTOMY WITH INTRAOPERATIVE CHOLANGIOGRAM;  Surgeon: Robert Bellow, MD;  Location: [facility];  Service: General;  Laterality: N/A;  . COLONOSCOPY  2014   Dr Candace Cruise  . COLONOSCOPY WITH PROPOFOL N/A 01/03/2018   Procedure: COLONOSCOPY WITH PROPOFOL;  Surgeon: Robert Bellow, MD;  Location: [facility];  Service: Endoscopy;  Laterality: N/A;  . ESOPHAGOGASTRODUODENOSCOPY (EGD) WITH PROPOFOL N/A 01/03/2018   Procedure: ESOPHAGOGASTRODUODENOSCOPY (EGD) WITH PROPOFOL;  Surgeon: Robert Bellow, MD;  Location: [facility];  Service: Endoscopy;  Laterality: N/A;  . FOOT SURGERY Right 2012   ankle fracture/surgery/plate  . HARDWARE REMOVAL Right 07/20/2016   Procedure: HARDWARE REMOVAL;  Surgeon: Samara Deist, DPM;  Location: [place];  Service: Podiatry;  Laterality: Right;  8 hole plate removed 9 screws removed plate and screws removed  intact  . MECKEL DIVERTICULUM EXCISION  1987   with abscess  . SHOULDER SURGERY Right 01/16/14  . TUBAL LIGATION    . UPPER GI ENDOSCOPY  2014   Dr Candace Cruise    Family History  Problem Relation Age of Onset  . Ovarian cancer Mother   . Breast cancer Mother   . Cancer Maternal Uncle        lung  . Cancer Maternal Grandmother        mouth  . Cancer Paternal Uncle        lung    Social History Social History   Tobacco Use  . Smoking status: Never Smoker  . Smokeless tobacco: Never Used  Substance Use Topics  . Alcohol use: No    Alcohol/week: 0.0 standard drinks  . Drug use: No    Allergies  Allergen Reactions  . Codeine Other (See Comments)    headache  . Penicillins Swelling  . Oxycodone Rash and Swelling    Current Outpatient Medications  Medication Sig Dispense Refill  . albuterol (PROVENTIL HFA;VENTOLIN HFA) 108 (90 BASE) MCG/ACT inhaler Inhale into the lungs every 6 (six) hours as needed for wheezing or shortness of breath.    . cyclobenzaprine (FLEXERIL) 5 MG tablet Take 5 mg by mouth 3 (three) times daily as needed for muscle spasms.    . ergocalciferol (VITAMIN D2) 50000 units capsule Take 50,000 Units by mouth once a week.    . estradiol (ESTRACE) 1 MG tablet Take 1 mg  by mouth daily.    . fluticasone (FLONASE) 50 MCG/ACT nasal spray Place 2 sprays into both nostrils daily as needed.     . furosemide (LASIX) 40 MG tablet Take 40 mg by mouth 2 (two) times daily.     [name] HYDROcodone-acetaminophen (NORCO/VICODIN) 5-325 MG tablet Take 1 tablet by mouth every 4 (four) hours as needed for moderate pain. 10 tablet 0  . isosorbide mononitrate (IMDUR) 30 MG 24 hr tablet Take 15 mg by mouth daily as needed.   11  . loratadine (CLARITIN) 10 MG tablet Take 10 mg by mouth daily.    [name] losartan (COZAAR) 25 MG tablet Take 25 mg by mouth daily.    . meloxicam (MOBIC) 7.5 MG tablet Take 7.5 mg by mouth daily as needed.     [name] omeprazole (PRILOSEC) 40 MG capsule Take 40 mg by mouth  daily.    . potassium chloride (K-DUR) 10 MEQ tablet Take 10 mEq by mouth daily.      No current facility-administered medications for this visit.     Review of Systems Review of Systems  Constitutional: Negative.   Respiratory: Negative.   Cardiovascular: Negative.     Blood pressure 122/76, pulse 74, height 4\' 9"  (1.448 m), weight 153 lb (69.4 kg).  Physical Exam Physical Exam  Constitutional: She is oriented to person, place, and time. She appears well-developed and well-nourished.  Abdominal:  Port sites are clean and healing well.   Neurological: She is alert and oriented to person, place, and time.  Skin: Skin is warm and dry.    Data Reviewed February 16, 2018 gallbladder pathology: A. GALLBLADDER; CHOLECYSTECTOMY:  - CHOLELITHIASIS AND CHRONIC CHOLECYSTITIS.  - NEGATIVE FOR DYSPLASIA AND MALIGNANCY.   Assessment    Doing well post cholecystectomy.  Modest postprandial diarrhea, likely dietary related.    Plan  Try low fat foods for the next few weeks.  Anticipate resolution of postprandial stools within the next 2-3 weeks.   If she fails to show steady improvement in stool frequency, she is been encouraged to call for reassessment.  Return as needed. The patient is aware to call back for any questions or concerns.   HPI, Physical Exam, Assessment and Plan have been scribed under the direction and in the presence of [name], MD.  [name], CMA  I have completed the exam and reviewed the above documentation for accuracy and completeness.  I agree with the above.  Haematologist has been used and any errors in dictation or transcription are unintentional.  [name], M.D., F.A.C.S.   [name] [name] 02/28/2018, 8:49 PM

## 2018-05-29 ENCOUNTER — Other Ambulatory Visit: Payer: Self-pay

## 2018-05-29 DIAGNOSIS — Z1231 Encounter for screening mammogram for malignant neoplasm of breast: Secondary | ICD-10-CM

## 2018-07-17 ENCOUNTER — Ambulatory Visit: Payer: Medicare HMO | Admitting: General Surgery

## 2018-09-11 ENCOUNTER — Ambulatory Visit: Payer: Medicare HMO | Admitting: General Surgery

## 2018-10-11 ENCOUNTER — Ambulatory Visit: Payer: Medicare HMO | Admitting: General Surgery

## 2018-10-25 ENCOUNTER — Ambulatory Visit
Admission: RE | Admit: 2018-10-25 | Discharge: 2018-10-25 | Disposition: A | Discharge status: Home / Self Care | Payer: Medicare HMO | Source: Ambulatory Visit | Attending: Surgery | Admitting: Surgery

## 2018-10-25 ENCOUNTER — Other Ambulatory Visit: Payer: Self-pay

## 2018-10-25 DIAGNOSIS — Z1231 Encounter for screening mammogram for malignant neoplasm of breast: Secondary | ICD-10-CM

## 2018-11-20 ENCOUNTER — Encounter: Payer: Self-pay | Admitting: General Surgery

## 2018-11-20 ENCOUNTER — Other Ambulatory Visit: Payer: Self-pay

## 2018-11-20 ENCOUNTER — Ambulatory Visit (INDEPENDENT_AMBULATORY_CARE_PROVIDER_SITE_OTHER): Payer: Medicare HMO | Admitting: General Surgery

## 2018-11-20 VITALS — BP 119/92 | HR 72 | Temp 97.7°F | Ht 60.0 in | Wt 150.0 lb

## 2018-11-20 DIAGNOSIS — Z1231 Encounter for screening mammogram for malignant neoplasm of breast: Secondary | ICD-10-CM

## 2018-11-20 DIAGNOSIS — Z803 Family history of malignant neoplasm of breast: Secondary | ICD-10-CM

## 2018-11-20 DIAGNOSIS — N951 Menopausal and female climacteric states: Secondary | ICD-10-CM

## 2018-11-20 MED ORDER — ESTRADIOL 2 MG PO TABS: ORAL_TABLET | ORAL | 0 refills | Status: AC

## 2018-11-20 MED ORDER — ASPIRIN EC 81 MG PO TBEC: 81.0000 mg | DELAYED_RELEASE_TABLET | Freq: Every day | ORAL | 0 refills | Status: AC

## 2018-11-20 MED ORDER — ESTRADIOL 0.5 MG PO TABS: ORAL_TABLET | ORAL | 0 refills | Status: AC

## 2018-11-20 NOTE — Progress Notes (Signed)
Patient ID: Mariah Hicks, female   DOB: 09/17/1945, 73 y.o.   MRN: [id number]  Chief Complaint  Patient presents with  . Follow-up    mammogram     HPI Mariah Hicks is a 73 y.o. female who presents for a breast evaluation. The most recent mammogram was done on 10/25/2018.  Patient does perform regular self breast checks and gets regular mammograms done.   Since her last visit her estradiol was decreased from 2 mg to 1 mg/day with marked worsening of her chronic vasomotor symptoms.  HPI  Past Medical History:  Diagnosis Date  . Headache    pinched nerve in neck.  Chiropracor monthly has relieved.  [name] Heart disease   . Hypertension 2005  . Lump or mass in breast 2013   right breast fna done 03/22/12, negative  . Obesity, unspecified   . Palpitations   . Seasonal allergies   . Solitary cyst of breast 2014    Past Surgical History:  Procedure Laterality Date  . ABDOMINAL HYSTERECTOMY  1987  . ANKLE ARTHROSCOPY Right 07/20/2016   Procedure: ANKLE ARTHROSCOPY  debridement right;  Surgeon: Samara Deist, DPM;  Location: [place];  Service: Podiatry;  Laterality: Right;  . APPENDECTOMY  1987  . BREAST BIOPSY Left 2010   neg  . CHOLECYSTECTOMY N/A 02/12/2018   Procedure: LAPAROSCOPIC CHOLECYSTECTOMY WITH INTRAOPERATIVE CHOLANGIOGRAM;  Surgeon: Robert Bellow, MD;  Location: [facility];  Service: General;  Laterality: N/A;  . COLONOSCOPY  2014   Dr Candace Cruise  . COLONOSCOPY WITH PROPOFOL N/A 01/03/2018   Procedure: COLONOSCOPY WITH PROPOFOL;  Surgeon: Robert Bellow, MD;  Location: [facility];  Service: Endoscopy;  Laterality: N/A;  . ESOPHAGOGASTRODUODENOSCOPY (EGD) WITH PROPOFOL N/A 01/03/2018   Procedure: ESOPHAGOGASTRODUODENOSCOPY (EGD) WITH PROPOFOL;  Surgeon: Robert Bellow, MD;  Location: [facility];  Service: Endoscopy;  Laterality: N/A;  . FOOT SURGERY Right 2012   ankle fracture/surgery/plate  . HARDWARE REMOVAL Right 07/20/2016   Procedure: HARDWARE  REMOVAL;  Surgeon: Samara Deist, DPM;  Location: [place];  Service: Podiatry;  Laterality: Right;  8 hole plate removed 9 screws removed plate and screws removed intact  . MECKEL DIVERTICULUM EXCISION  1987   with abscess  . SHOULDER SURGERY Right 01/16/14  . TUBAL LIGATION    . UPPER GI ENDOSCOPY  2014   Dr Candace Cruise    Family History  Problem Relation Age of Onset  . Ovarian cancer Mother   . Breast cancer Mother   . Cancer Maternal Uncle        lung  . Cancer Maternal Grandmother        mouth  . Cancer Paternal Uncle        lung    Social History Social History   Tobacco Use  . Smoking status: Never Smoker  . Smokeless tobacco: Never Used  Substance Use Topics  . Alcohol use: No    Alcohol/week: 0.0 standard drinks  . Drug use: No    Allergies  Allergen Reactions  . Codeine Other (See Comments)    headache  . Penicillins Swelling  . Oxycodone Rash and Swelling    Current Outpatient Medications  Medication Sig Dispense Refill  . albuterol (PROVENTIL HFA;VENTOLIN HFA) 108 (90 BASE) MCG/ACT inhaler Inhale into the lungs every 6 (six) hours as needed for wheezing or shortness of breath.    . cyclobenzaprine (FLEXERIL) 5 MG tablet Take 5 mg by mouth 3 (three) times daily as needed for muscle  spasms.    . ergocalciferol (VITAMIN D2) 50000 units capsule Take 50,000 Units by mouth once a week.    . fluticasone (FLONASE) 50 MCG/ACT nasal spray Place 2 sprays into both nostrils daily as needed.     . furosemide (LASIX) 40 MG tablet Take 40 mg by mouth 2 (two) times daily.     . isosorbide mononitrate (IMDUR) 30 MG 24 hr tablet Take 15 mg by mouth daily as needed.   11  . loratadine (CLARITIN) 10 MG tablet Take 10 mg by mouth daily.    [name] losartan (COZAAR) 25 MG tablet Take 25 mg by mouth daily.    . meloxicam (MOBIC) 7.5 MG tablet Take 7.5 mg by mouth daily as needed.     [name] omeprazole (PRILOSEC) 40 MG capsule Take 40 mg by mouth daily.    . potassium chloride  (K-DUR) 10 MEQ tablet Take 10 mEq by mouth daily.     [name] aspirin EC 81 MG tablet Take 1 tablet (81 mg total) by mouth daily. 100 tablet 0  . estradiol (ESTRACE) 0.5 MG tablet Take a total of 2.5 mg daily. 90 tablet 0  . estradiol (ESTRACE) 2 MG tablet Take a total of 2.5 mg daily. 90 tablet 0   No current facility-administered medications for this visit.     Review of Systems Review of Systems  Constitutional: Negative.   Respiratory: Negative.   Cardiovascular: Negative.     Blood pressure (!) 119/92, pulse 72, temperature 97.7 F (36.5 C), temperature source Skin, height 5' (1.524 m), weight 150 lb (68 kg), SpO2 98 %.  Physical Exam Physical Exam Constitutional:      Appearance: She is well-developed.  Eyes:     General: No scleral icterus.    Conjunctiva/sclera: Conjunctivae normal.  Neck:     Musculoskeletal: Neck supple.  Cardiovascular:     Rate and Rhythm: Normal rate and regular rhythm.     Heart sounds: Normal heart sounds.  Pulmonary:     Effort: Pulmonary effort is normal.     Breath sounds: Normal breath sounds.  Chest:     Breasts:        Right: Normal.        Left: Normal.  Lymphadenopathy:     Cervical: No cervical adenopathy.  Skin:    General: Skin is warm and dry.  Neurological:     Mental Status: She is alert and oriented to person, place, and time.     Data Reviewed Bilateral screening mammograms dated Oct 25, 2018 were independently reviewed.  BI-RADS-1.  Assessment Stable breast exam.  Progressive vasomotor symptoms on low-dose estradiol.  Plan It would be certainly ideal if her symptoms could be controlled without estrogens, but her symptoms seem to be worsening based on her description.  Her breast exam is benign as are her mammograms, within the limits of dense breasts.  She is aware that ongoing estrogen use does put her at risk for breast cancer.  We will have the patient make use of estradiol 2 mg +0.5 mg tablet daily for 59-month  trial.  If her symptoms are not significantly improved would consider a trial of Effexor which has about an 80% chance of relieving vasomotor symptoms.  A 53-month supply of the above-mentioned medications were provided so were sure to get a call back at the 50-month window.  She has been asked to make use of a pediatric aspirin daily to minimize the risk of DVT.  Return in one year.   [name]  HPI, Physical Exam, Assessment and Plan have been scribed under the direction and in the presence of [name], MD.  [name], CMA  I have completed the exam and reviewed the above documentation for accuracy and completeness.  I agree with the above.  Haematologist has been used and any errors in dictation or transcription are unintentional.  [name], M.D., F.A.C.S.  [name]  11/21/2018, 9:00 PM

## 2018-11-20 NOTE — Patient Instructions (Addendum)
Patient to take a 81 mg aspirin daily. Call and report how they you doing on estradiol. Return in one year.

## 2018-11-21 DIAGNOSIS — N951 Menopausal and female climacteric states: Secondary | ICD-10-CM | POA: Insufficient documentation

## 2018-12-28 ENCOUNTER — Encounter: Payer: Self-pay | Admitting: General Surgery

## 2019-01-02 ENCOUNTER — Other Ambulatory Visit: Payer: Self-pay | Admitting: General Practice

## 2019-01-02 NOTE — Telephone Encounter (Signed)
Patient will also need a referral to [facility], but is not due until 2024.

## 2019-01-02 NOTE — Telephone Encounter (Signed)
I talked with the patient about Dr Bary Castilla no longer being with the practice and she is aware that she will need to go through her PCP, Dr Doy Hutching for refills and the referral in 2024. She agrees.

## 2019-01-02 NOTE — Telephone Encounter (Signed)
Patient is calling said she was told to call to let us know that her medication is working, and that she would be needing a refill by the end of the month. Please call patient and advise.

## 2019-04-16 DIAGNOSIS — Z0000 Encounter for general adult medical examination without abnormal findings: Secondary | ICD-10-CM | POA: Diagnosis not present

## 2019-04-16 DIAGNOSIS — Z79899 Other long term (current) drug therapy: Secondary | ICD-10-CM | POA: Diagnosis not present

## 2019-04-16 DIAGNOSIS — R7309 Other abnormal glucose: Secondary | ICD-10-CM | POA: Diagnosis not present

## 2019-04-16 DIAGNOSIS — I10 Essential (primary) hypertension: Secondary | ICD-10-CM | POA: Diagnosis not present

## 2019-04-16 DIAGNOSIS — E559 Vitamin D deficiency, unspecified: Secondary | ICD-10-CM | POA: Diagnosis not present

## 2019-04-16 DIAGNOSIS — E782 Mixed hyperlipidemia: Secondary | ICD-10-CM | POA: Diagnosis not present

## 2019-05-09 DIAGNOSIS — R7309 Other abnormal glucose: Secondary | ICD-10-CM | POA: Diagnosis not present

## 2019-05-09 DIAGNOSIS — H524 Presbyopia: Secondary | ICD-10-CM | POA: Diagnosis not present

## 2019-05-09 DIAGNOSIS — E782 Mixed hyperlipidemia: Secondary | ICD-10-CM | POA: Diagnosis not present

## 2019-05-09 DIAGNOSIS — I10 Essential (primary) hypertension: Secondary | ICD-10-CM | POA: Diagnosis not present

## 2019-05-09 DIAGNOSIS — H25813 Combined forms of age-related cataract, bilateral: Secondary | ICD-10-CM | POA: Diagnosis not present

## 2019-05-09 DIAGNOSIS — Z79899 Other long term (current) drug therapy: Secondary | ICD-10-CM | POA: Diagnosis not present

## 2019-05-09 DIAGNOSIS — E559 Vitamin D deficiency, unspecified: Secondary | ICD-10-CM | POA: Diagnosis not present

## 2019-05-16 ENCOUNTER — Other Ambulatory Visit: Payer: Self-pay

## 2019-05-16 ENCOUNTER — Ambulatory Visit: Payer: Medicare HMO | Attending: Internal Medicine

## 2019-05-16 DIAGNOSIS — Z20822 Contact with and (suspected) exposure to covid-19: Secondary | ICD-10-CM

## 2019-05-16 DIAGNOSIS — Z20828 Contact with and (suspected) exposure to other viral communicable diseases: Secondary | ICD-10-CM | POA: Diagnosis not present

## 2019-05-18 ENCOUNTER — Telehealth: Payer: Self-pay | Admitting: General Practice

## 2019-05-18 LAB — NOVEL CORONAVIRUS, NAA: SARS-CoV-2, NAA: NOT DETECTED

## 2019-05-18 NOTE — Telephone Encounter (Signed)
Negative COVID results given. Patient results "NOT Detected." Caller expressed understanding. ° °

## 2019-07-03 IMAGING — XA DG CHOLANGIOGRAM OPERATIVE
1 series · 4 of 4 positions shown · non-contrast
Comparison: Right upper quadrant abdominal ultrasound on 12/20/2017

CLINICAL DATA: Cholecystectomy for symptomatic cholelithiasis.

EXAM:
INTRAOPERATIVE CHOLANGIOGRAM
TECHNIQUE: Cholangiographic images from the C-arm fluoroscopic device were
submitted for interpretation post-operatively. Please see the
procedural report for the amount of contrast and the fluoroscopy
time utilized.

[Series 2: cont. · 4 of 21 frames shown]
[frame 4/21]
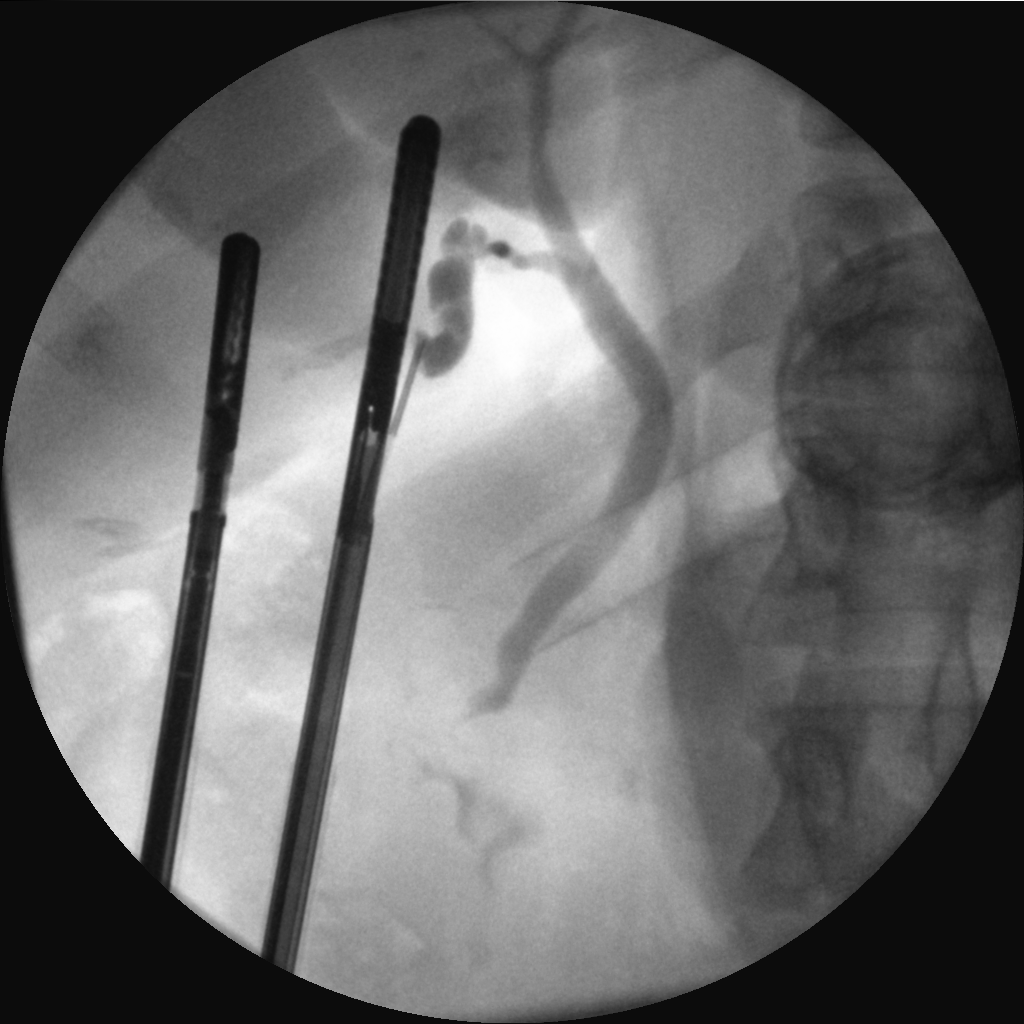
[frame 5/21]
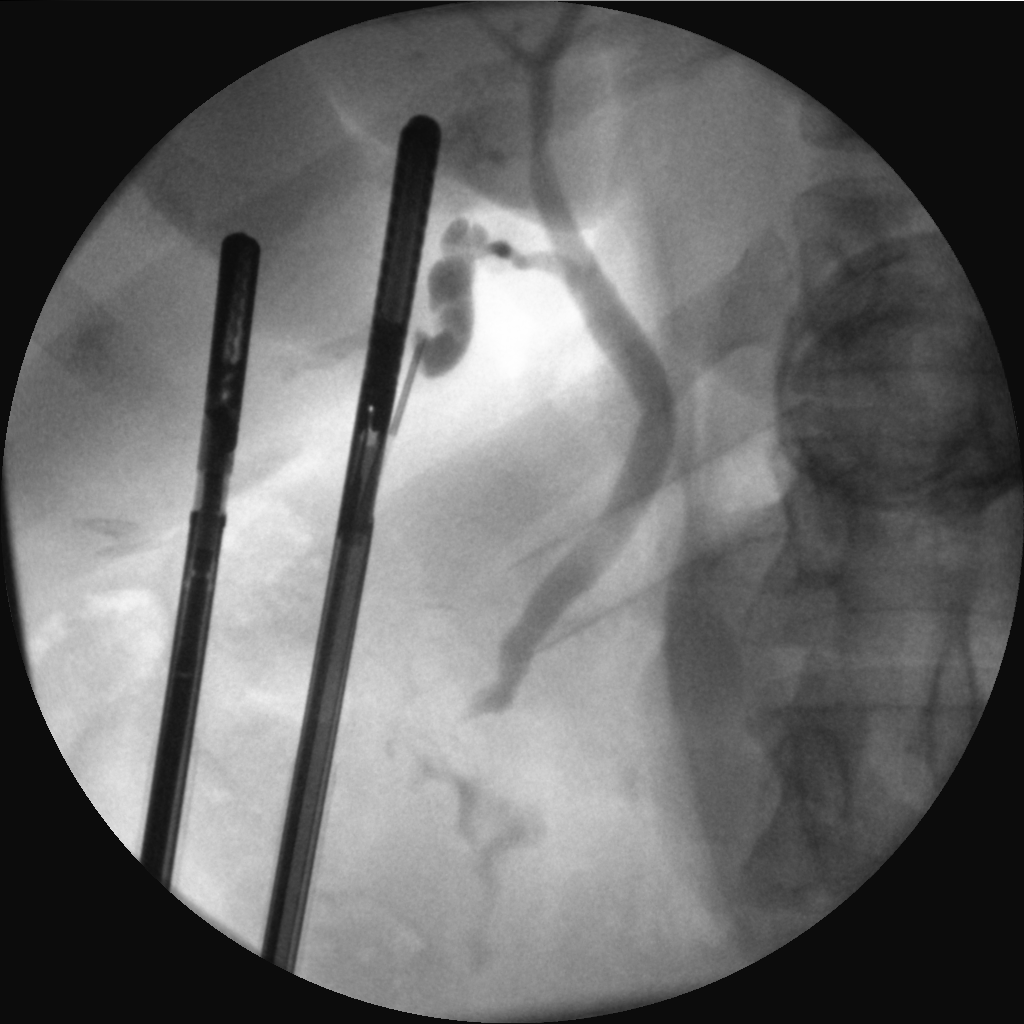
[frame 11/21]
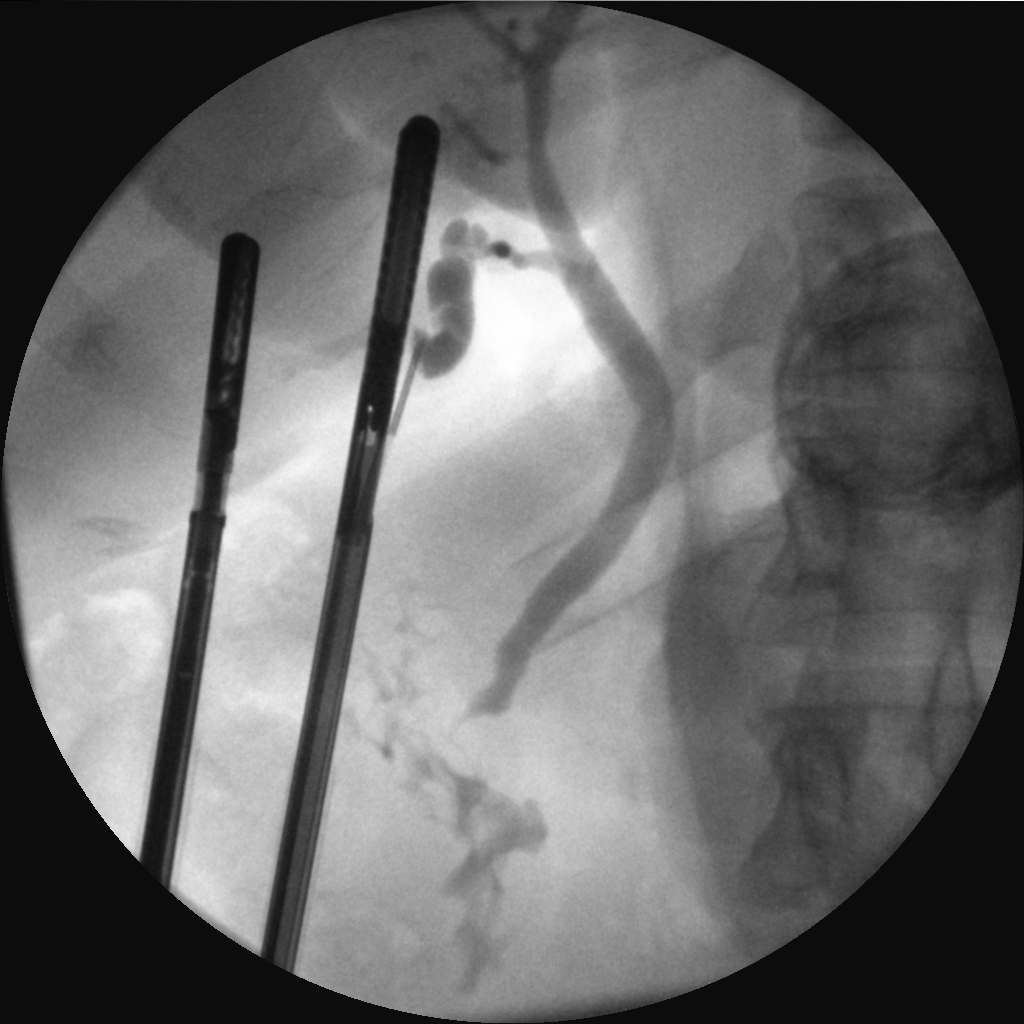
[frame 18/21]
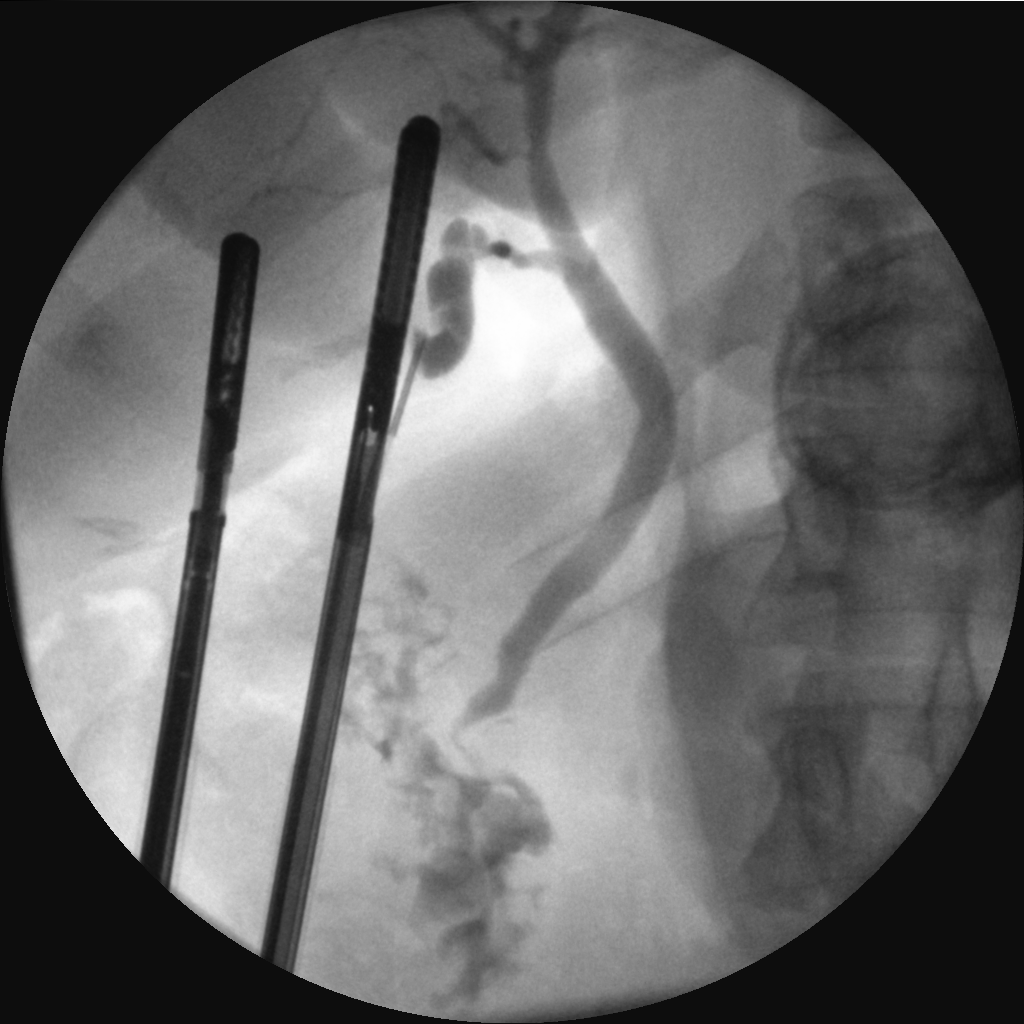

[4 of 4 positions shown; findings below may reference images not displayed]

FINDINGS: Intraoperative cholangiogram obtained with a C-arm demonstrates
normal opacified bile ducts with no evidence biliary obstruction or
filling defects. The common bile duct is well opacified and has a
normal appearance. Contrast enters the duodenum normally. No
contrast extravasation identified.
IMPRESSION: Normal intraoperative cholangiogram.

## 2019-07-28 ENCOUNTER — Ambulatory Visit: Payer: Medicare HMO | Attending: Internal Medicine

## 2019-07-28 DIAGNOSIS — Z23 Encounter for immunization: Secondary | ICD-10-CM | POA: Insufficient documentation

## 2019-07-28 NOTE — Progress Notes (Signed)
   Covid-19 Vaccination Clinic  Name:  Mariah Hicks    MRN: [id number] DOB: 1945-06-02  07/28/2019  [name] was observed post Covid-19 immunization for 15 minutes without incidence. She was provided with Vaccine Information Sheet and instruction to access the V-Safe system.   [name] was instructed to call 911 with any severe reactions post vaccine: [name] Difficulty breathing  . Swelling of your face and throat  . A fast heartbeat  . A bad rash all over your body  . Dizziness and weakness    Immunizations Administered    Name Date Dose VIS Date Route   Pfizer COVID-19 Vaccine 07/28/2019  9:24 AM 0.3 mL 05/10/2019 Intramuscular   Manufacturer: [place]   Lot: [id number]   [place]: [id number]

## 2019-08-19 ENCOUNTER — Ambulatory Visit: Payer: Medicare HMO | Attending: Internal Medicine

## 2019-08-19 DIAGNOSIS — Z23 Encounter for immunization: Secondary | ICD-10-CM

## 2019-08-19 NOTE — Progress Notes (Signed)
   Covid-19 Vaccination Clinic  Name:  DANIELLE GAMBINO    MRN: [id number] DOB: [birth year]  08/19/2019  Ms. Leifer was observed post Covid-19 immunization for 15 minutes without incident. She was provided with Vaccine Information Sheet and instruction to access the V-Safe system.   Ms. Cremeans was instructed to call 911 with any severe reactions post vaccine: [name] Difficulty breathing  . Swelling of face and throat  . A fast heartbeat  . A bad rash all over body  . Dizziness and weakness   Immunizations Administered    Name Date Dose VIS Date Route   Pfizer COVID-19 Vaccine 08/19/2019  9:13 AM 0.3 mL 05/10/2019 Intramuscular   Manufacturer: [place]   Lot: [id number]   [place]: [id number]

## 2019-09-12 ENCOUNTER — Other Ambulatory Visit: Payer: Self-pay | Admitting: General Surgery

## 2019-09-12 DIAGNOSIS — N6019 Diffuse cystic mastopathy of unspecified breast: Secondary | ICD-10-CM

## 2019-10-14 DIAGNOSIS — E559 Vitamin D deficiency, unspecified: Secondary | ICD-10-CM | POA: Diagnosis not present

## 2019-10-14 DIAGNOSIS — R739 Hyperglycemia, unspecified: Secondary | ICD-10-CM | POA: Diagnosis not present

## 2019-10-14 DIAGNOSIS — Z0000 Encounter for general adult medical examination without abnormal findings: Secondary | ICD-10-CM | POA: Diagnosis not present

## 2019-10-14 DIAGNOSIS — E785 Hyperlipidemia, unspecified: Secondary | ICD-10-CM | POA: Diagnosis not present

## 2019-10-24 DIAGNOSIS — R7309 Other abnormal glucose: Secondary | ICD-10-CM | POA: Diagnosis not present

## 2019-10-24 DIAGNOSIS — I10 Essential (primary) hypertension: Secondary | ICD-10-CM | POA: Diagnosis not present

## 2019-10-24 DIAGNOSIS — E782 Mixed hyperlipidemia: Secondary | ICD-10-CM | POA: Diagnosis not present

## 2019-10-24 DIAGNOSIS — Z79899 Other long term (current) drug therapy: Secondary | ICD-10-CM | POA: Diagnosis not present

## 2019-10-29 ENCOUNTER — Ambulatory Visit
Admission: RE | Admit: 2019-10-29 | Discharge: 2019-10-29 | Disposition: A | Discharge status: Home / Self Care | Payer: Medicare HMO | Source: Ambulatory Visit | Attending: General Surgery | Admitting: General Surgery

## 2019-10-29 DIAGNOSIS — N6019 Diffuse cystic mastopathy of unspecified breast: Secondary | ICD-10-CM | POA: Diagnosis not present

## 2019-10-29 DIAGNOSIS — Z1231 Encounter for screening mammogram for malignant neoplasm of breast: Secondary | ICD-10-CM | POA: Insufficient documentation

## 2019-10-30 ENCOUNTER — Other Ambulatory Visit: Payer: Self-pay | Admitting: General Surgery

## 2019-10-30 DIAGNOSIS — R928 Other abnormal and inconclusive findings on diagnostic imaging of breast: Secondary | ICD-10-CM

## 2019-11-05 DIAGNOSIS — Z1231 Encounter for screening mammogram for malignant neoplasm of breast: Secondary | ICD-10-CM | POA: Diagnosis not present

## 2019-11-05 DIAGNOSIS — R928 Other abnormal and inconclusive findings on diagnostic imaging of breast: Secondary | ICD-10-CM | POA: Diagnosis not present

## 2019-11-08 ENCOUNTER — Ambulatory Visit
Admission: RE | Admit: 2019-11-08 | Discharge: 2019-11-08 | Disposition: A | Discharge status: Home / Self Care | Payer: Medicare HMO | Source: Ambulatory Visit | Attending: General Surgery | Admitting: General Surgery

## 2019-11-08 DIAGNOSIS — R928 Other abnormal and inconclusive findings on diagnostic imaging of breast: Secondary | ICD-10-CM

## 2019-11-08 DIAGNOSIS — R922 Inconclusive mammogram: Secondary | ICD-10-CM | POA: Diagnosis not present

## 2020-03-23 ENCOUNTER — Ambulatory Visit: Payer: Medicare HMO | Attending: Internal Medicine

## 2020-03-23 DIAGNOSIS — Z23 Encounter for immunization: Secondary | ICD-10-CM

## 2020-03-23 NOTE — Progress Notes (Signed)
   Covid-19 Vaccination Clinic  Name:  Mariah Hicks    MRN: [id number] DOB: Sep 21, 1945  03/23/2020  Mariah Hicks was observed post Covid-19 immunization for 15 minutes without incident. She was provided with Vaccine Information Sheet and instruction to access the V-Safe system.   Mariah Hicks was instructed to call 911 with any severe reactions post vaccine: [name] Difficulty breathing  . Swelling of face and throat  . A fast heartbeat  . A bad rash all over body  . Dizziness and weakness

## 2020-05-12 DIAGNOSIS — M25512 Pain in left shoulder: Secondary | ICD-10-CM | POA: Diagnosis not present

## 2020-05-12 DIAGNOSIS — Z79899 Other long term (current) drug therapy: Secondary | ICD-10-CM | POA: Diagnosis not present

## 2020-05-12 DIAGNOSIS — M19012 Primary osteoarthritis, left shoulder: Secondary | ICD-10-CM | POA: Diagnosis not present

## 2020-05-12 DIAGNOSIS — M25552 Pain in left hip: Secondary | ICD-10-CM | POA: Diagnosis not present

## 2020-05-12 DIAGNOSIS — I10 Essential (primary) hypertension: Secondary | ICD-10-CM | POA: Diagnosis not present

## 2020-05-12 DIAGNOSIS — E782 Mixed hyperlipidemia: Secondary | ICD-10-CM | POA: Diagnosis not present

## 2020-05-12 DIAGNOSIS — R739 Hyperglycemia, unspecified: Secondary | ICD-10-CM | POA: Diagnosis not present

## 2020-08-27 DIAGNOSIS — Z79899 Other long term (current) drug therapy: Secondary | ICD-10-CM | POA: Diagnosis not present

## 2020-08-27 DIAGNOSIS — R739 Hyperglycemia, unspecified: Secondary | ICD-10-CM | POA: Diagnosis not present

## 2020-08-27 DIAGNOSIS — I10 Essential (primary) hypertension: Secondary | ICD-10-CM | POA: Diagnosis not present

## 2020-08-27 DIAGNOSIS — E785 Hyperlipidemia, unspecified: Secondary | ICD-10-CM | POA: Diagnosis not present

## 2020-08-27 DIAGNOSIS — Z0000 Encounter for general adult medical examination without abnormal findings: Secondary | ICD-10-CM | POA: Diagnosis not present

## 2020-08-27 DIAGNOSIS — E559 Vitamin D deficiency, unspecified: Secondary | ICD-10-CM | POA: Diagnosis not present

## 2020-10-09 ENCOUNTER — Other Ambulatory Visit: Payer: Self-pay | Admitting: General Surgery

## 2020-10-09 DIAGNOSIS — N6019 Diffuse cystic mastopathy of unspecified breast: Secondary | ICD-10-CM

## 2020-10-29 ENCOUNTER — Other Ambulatory Visit: Payer: Self-pay

## 2020-10-29 ENCOUNTER — Ambulatory Visit
Admission: RE | Admit: 2020-10-29 | Discharge: 2020-10-29 | Disposition: A | Discharge status: Home / Self Care | Payer: Medicare HMO | Source: Ambulatory Visit | Attending: General Surgery | Admitting: General Surgery

## 2020-10-29 DIAGNOSIS — Z1231 Encounter for screening mammogram for malignant neoplasm of breast: Secondary | ICD-10-CM | POA: Insufficient documentation

## 2020-10-29 DIAGNOSIS — N6019 Diffuse cystic mastopathy of unspecified breast: Secondary | ICD-10-CM | POA: Diagnosis not present

## 2020-11-05 DIAGNOSIS — N951 Menopausal and female climacteric states: Secondary | ICD-10-CM | POA: Diagnosis not present

## 2021-01-06 DIAGNOSIS — E782 Mixed hyperlipidemia: Secondary | ICD-10-CM | POA: Diagnosis not present

## 2021-01-06 DIAGNOSIS — E559 Vitamin D deficiency, unspecified: Secondary | ICD-10-CM | POA: Diagnosis not present

## 2021-01-06 DIAGNOSIS — R7309 Other abnormal glucose: Secondary | ICD-10-CM | POA: Diagnosis not present

## 2021-01-06 DIAGNOSIS — Z79899 Other long term (current) drug therapy: Secondary | ICD-10-CM | POA: Diagnosis not present

## 2021-01-06 DIAGNOSIS — I10 Essential (primary) hypertension: Secondary | ICD-10-CM | POA: Diagnosis not present

## 2021-01-13 DIAGNOSIS — Z0000 Encounter for general adult medical examination without abnormal findings: Secondary | ICD-10-CM | POA: Diagnosis not present

## 2021-01-13 DIAGNOSIS — E782 Mixed hyperlipidemia: Secondary | ICD-10-CM | POA: Diagnosis not present

## 2021-01-13 DIAGNOSIS — Z79899 Other long term (current) drug therapy: Secondary | ICD-10-CM | POA: Diagnosis not present

## 2021-01-13 DIAGNOSIS — E559 Vitamin D deficiency, unspecified: Secondary | ICD-10-CM | POA: Diagnosis not present

## 2021-01-13 DIAGNOSIS — I10 Essential (primary) hypertension: Secondary | ICD-10-CM | POA: Diagnosis not present

## 2021-01-13 DIAGNOSIS — R7309 Other abnormal glucose: Secondary | ICD-10-CM | POA: Diagnosis not present

## 2021-03-28 IMAGING — MG MM DIGITAL DIAGNOSTIC UNILAT*R* W/ TOMO W/ CAD
8 series · 8 of 24 positions shown · non-contrast
Comparison: Previous exam(s).

CLINICAL DATA: 73-year-old female for further evaluation of
possible RIGHT breast distortion identified on screening mammogram.

EXAM:
DIGITAL DIAGNOSTIC UNILATERAL RIGHT MAMMOGRAM WITH CAD AND TOMO

[R MLO synth-2D (1 of 3)]
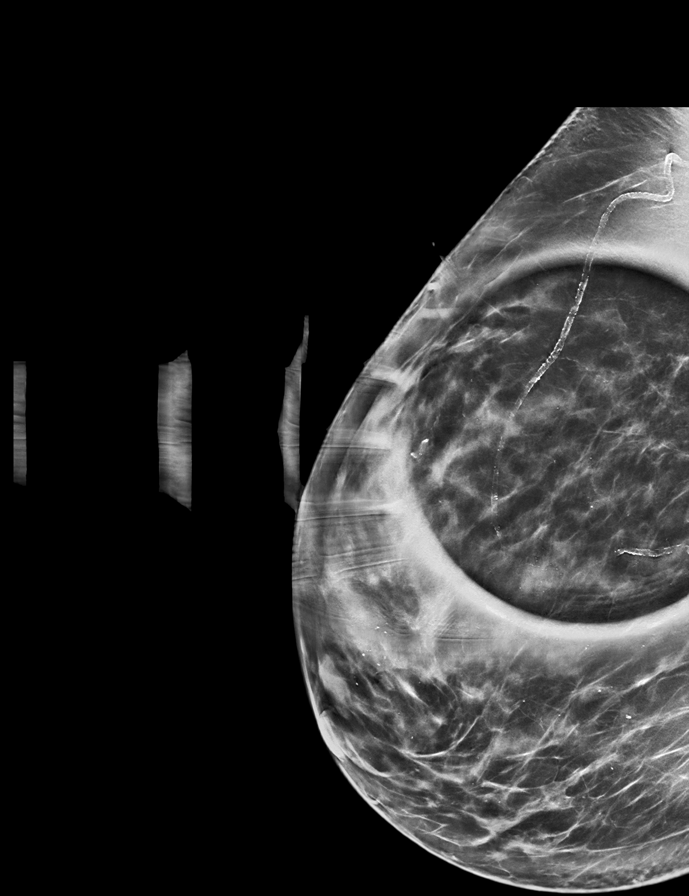

[R ML synth-2D]
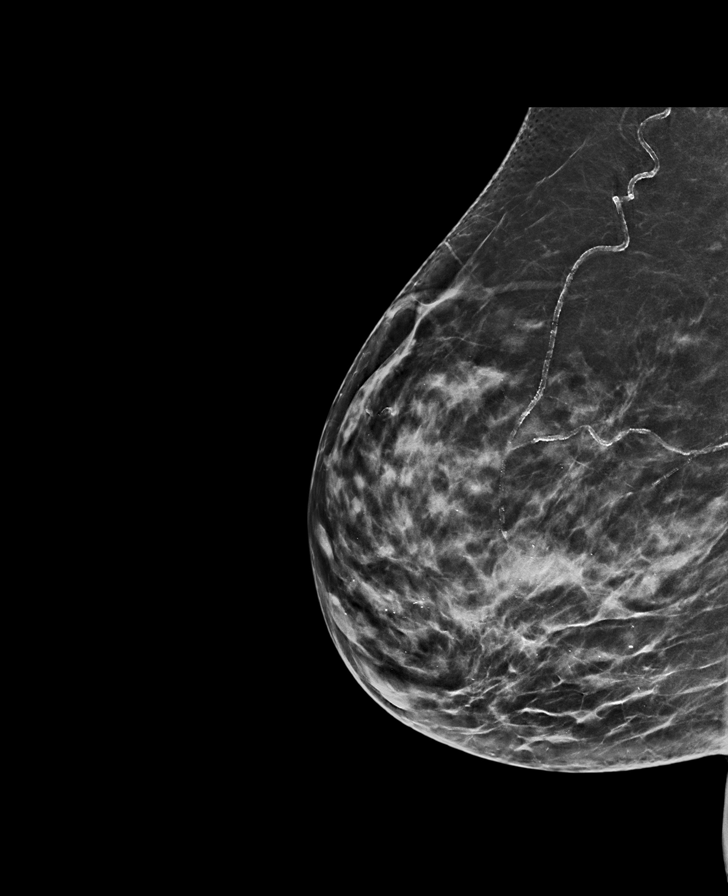

[R MLO synth-2D (2 of 3)]
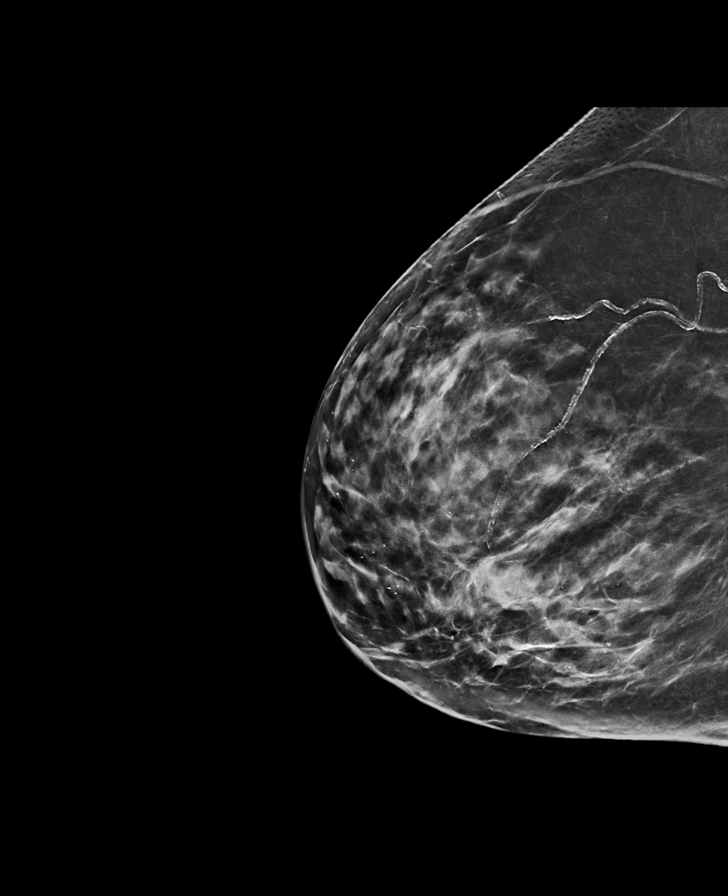

[R MLO synth-2D (3 of 3)]
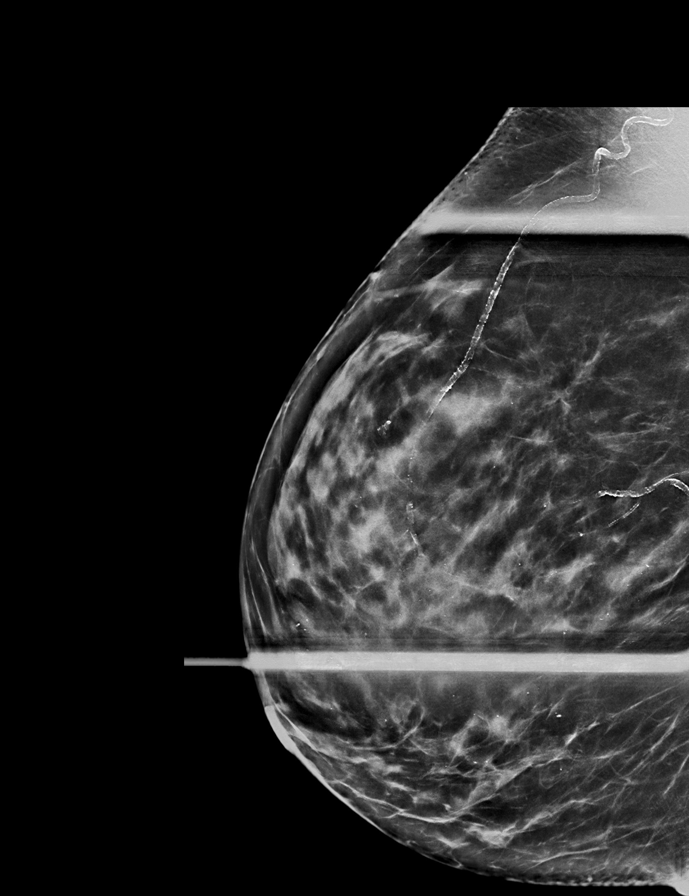

[R MLO tomo (1 of 3) · tomo slice 42/83.0]
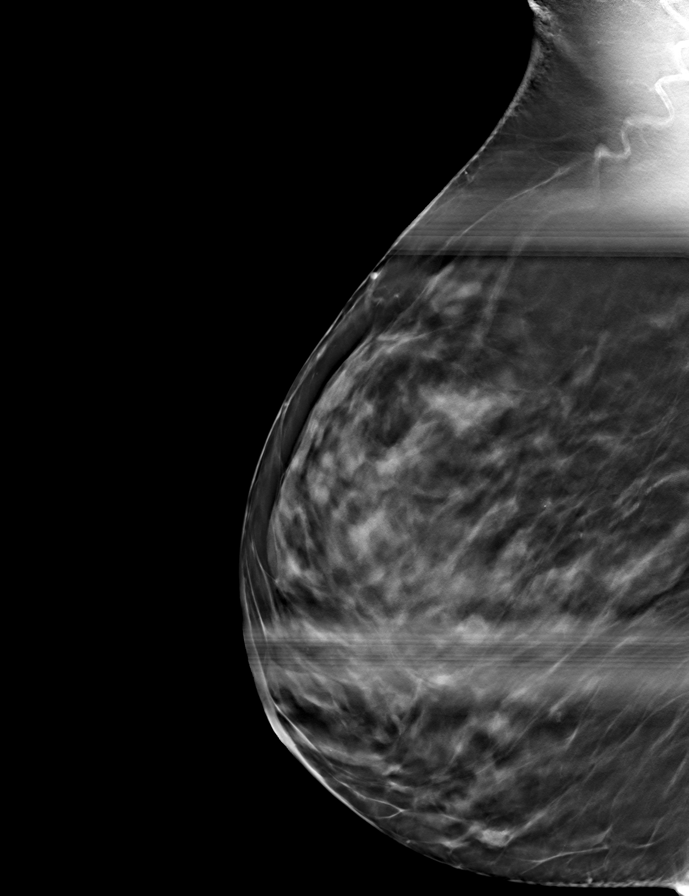

[R MLO tomo (2 of 3) · tomo slice 37/73.0]
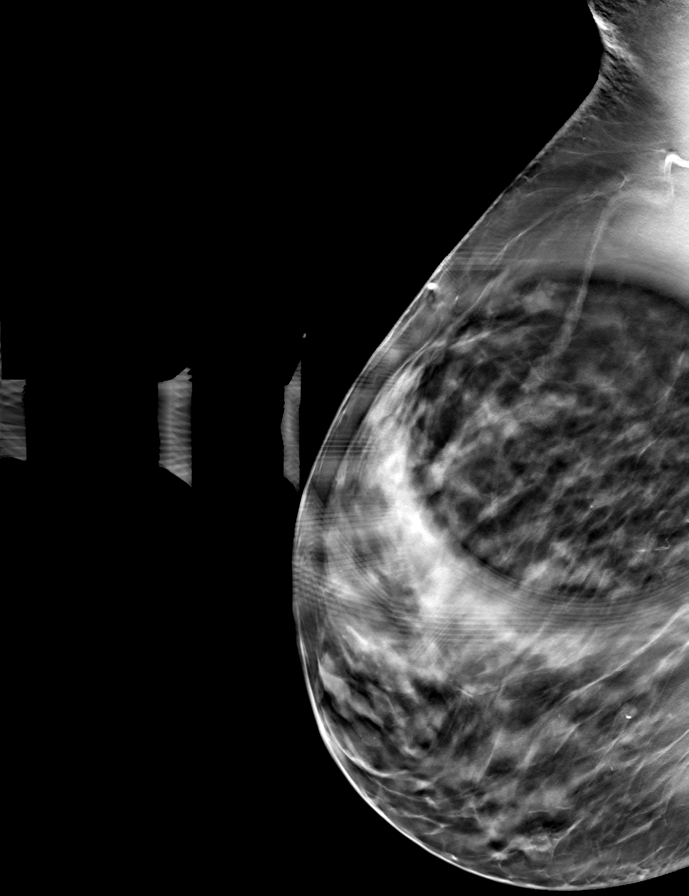

[R ML tomo · tomo slice 33/65.0]
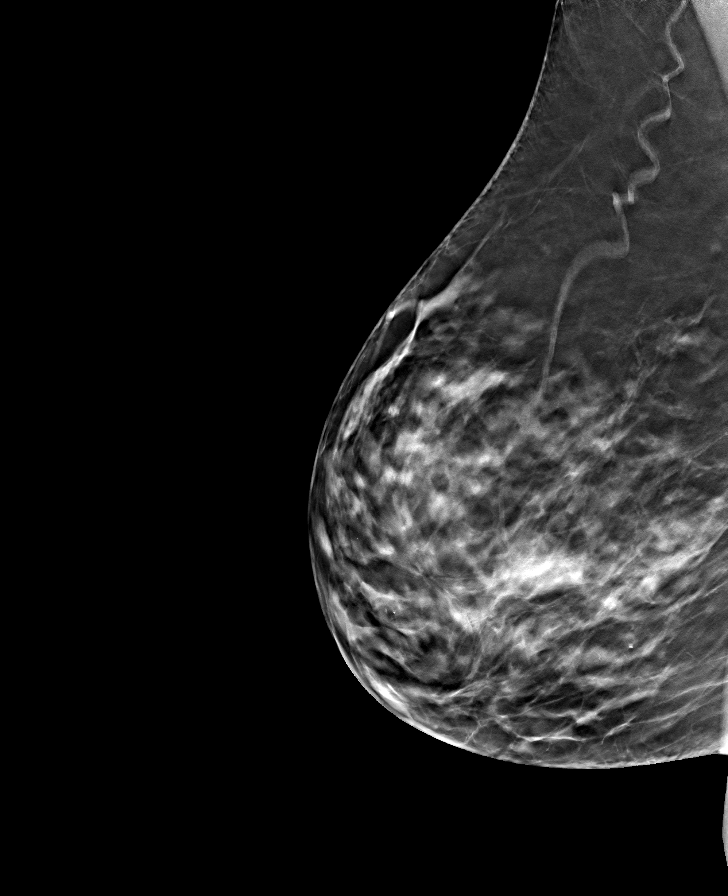

[R MLO tomo (3 of 3) · tomo slice 33/66.0]
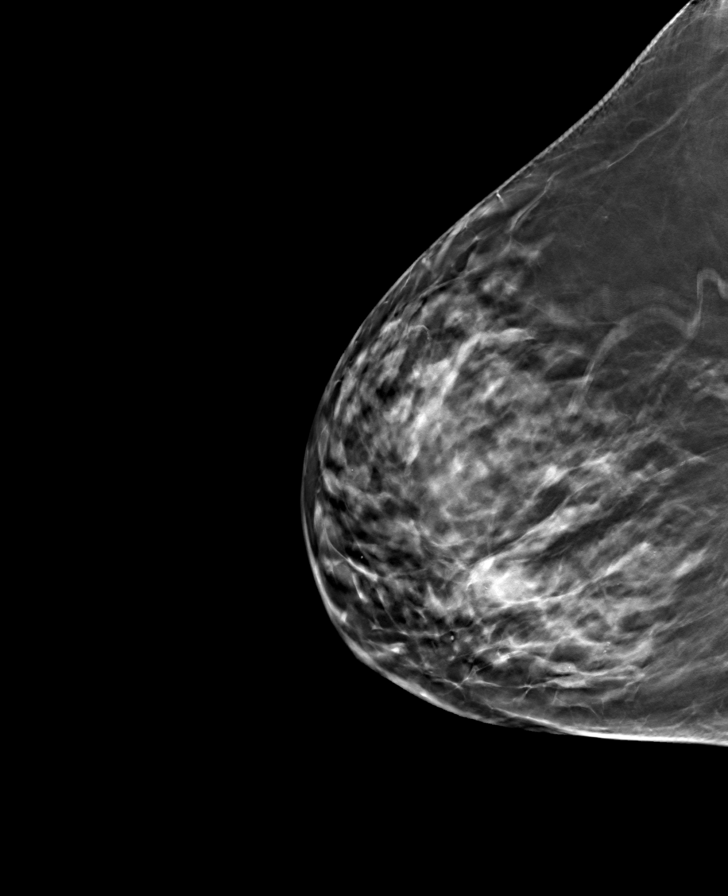

[8 of 24 positions shown; findings below may reference images not displayed]

ACR Breast Density Category c: The breast tissue is heterogeneously
dense, which may obscure small masses.
FINDINGS: 2D/3D full field and spot compression views of the RIGHT breast
demonstrate no persistent suspicious abnormality at the site of the
screening study finding. On today's images, this area is unchanged
from 1866.

Mammographic images were processed with CAD.
IMPRESSION: No persistent suspicious abnormality at the site of the screening
study finding.

RECOMMENDATION:
Bilateral screening mammogram in 1 year.

I have discussed the findings and recommendations with the patient.
If applicable, a reminder letter will be sent to the patient
regarding the next appointment.

BI-RADS CATEGORY  1: Negative.

## 2021-04-14 DIAGNOSIS — E782 Mixed hyperlipidemia: Secondary | ICD-10-CM | POA: Diagnosis not present

## 2021-04-14 DIAGNOSIS — Z79899 Other long term (current) drug therapy: Secondary | ICD-10-CM | POA: Diagnosis not present

## 2021-04-14 DIAGNOSIS — E559 Vitamin D deficiency, unspecified: Secondary | ICD-10-CM | POA: Diagnosis not present

## 2021-04-14 DIAGNOSIS — I10 Essential (primary) hypertension: Secondary | ICD-10-CM | POA: Diagnosis not present

## 2021-04-19 DIAGNOSIS — E559 Vitamin D deficiency, unspecified: Secondary | ICD-10-CM | POA: Diagnosis not present

## 2021-04-19 DIAGNOSIS — I10 Essential (primary) hypertension: Secondary | ICD-10-CM | POA: Diagnosis not present

## 2021-04-19 DIAGNOSIS — Z79899 Other long term (current) drug therapy: Secondary | ICD-10-CM | POA: Diagnosis not present

## 2021-04-19 DIAGNOSIS — R739 Hyperglycemia, unspecified: Secondary | ICD-10-CM | POA: Diagnosis not present

## 2021-04-19 DIAGNOSIS — M25511 Pain in right shoulder: Secondary | ICD-10-CM | POA: Diagnosis not present

## 2021-04-19 DIAGNOSIS — E782 Mixed hyperlipidemia: Secondary | ICD-10-CM | POA: Diagnosis not present

## 2021-07-14 DIAGNOSIS — E782 Mixed hyperlipidemia: Secondary | ICD-10-CM | POA: Diagnosis not present

## 2021-07-14 DIAGNOSIS — Z79899 Other long term (current) drug therapy: Secondary | ICD-10-CM | POA: Diagnosis not present

## 2021-07-14 DIAGNOSIS — R7309 Other abnormal glucose: Secondary | ICD-10-CM | POA: Diagnosis not present

## 2021-07-21 DIAGNOSIS — I10 Essential (primary) hypertension: Secondary | ICD-10-CM | POA: Diagnosis not present

## 2021-07-21 DIAGNOSIS — Z0000 Encounter for general adult medical examination without abnormal findings: Secondary | ICD-10-CM | POA: Diagnosis not present

## 2021-07-21 DIAGNOSIS — E559 Vitamin D deficiency, unspecified: Secondary | ICD-10-CM | POA: Diagnosis not present

## 2021-07-21 DIAGNOSIS — Z79899 Other long term (current) drug therapy: Secondary | ICD-10-CM | POA: Diagnosis not present

## 2021-07-21 DIAGNOSIS — E785 Hyperlipidemia, unspecified: Secondary | ICD-10-CM | POA: Diagnosis not present

## 2021-07-21 DIAGNOSIS — R739 Hyperglycemia, unspecified: Secondary | ICD-10-CM | POA: Diagnosis not present

## 2021-09-24 ENCOUNTER — Other Ambulatory Visit: Payer: Self-pay | Admitting: General Surgery

## 2021-09-24 DIAGNOSIS — N6019 Diffuse cystic mastopathy of unspecified breast: Secondary | ICD-10-CM

## 2021-11-01 ENCOUNTER — Ambulatory Visit
Admission: RE | Admit: 2021-11-01 | Discharge: 2021-11-01 | Disposition: A | Discharge status: Home / Self Care | Payer: Medicare HMO | Source: Ambulatory Visit | Attending: General Surgery | Admitting: General Surgery

## 2021-11-01 DIAGNOSIS — N6019 Diffuse cystic mastopathy of unspecified breast: Secondary | ICD-10-CM

## 2021-11-01 DIAGNOSIS — Z1231 Encounter for screening mammogram for malignant neoplasm of breast: Secondary | ICD-10-CM | POA: Insufficient documentation

## 2021-11-09 DIAGNOSIS — N951 Menopausal and female climacteric states: Secondary | ICD-10-CM | POA: Diagnosis not present

## 2022-01-11 DIAGNOSIS — R8290 Unspecified abnormal findings in urine: Secondary | ICD-10-CM | POA: Diagnosis not present

## 2022-01-11 DIAGNOSIS — R7309 Other abnormal glucose: Secondary | ICD-10-CM | POA: Diagnosis not present

## 2022-01-11 DIAGNOSIS — E559 Vitamin D deficiency, unspecified: Secondary | ICD-10-CM | POA: Diagnosis not present

## 2022-01-11 DIAGNOSIS — E782 Mixed hyperlipidemia: Secondary | ICD-10-CM | POA: Diagnosis not present

## 2022-01-11 DIAGNOSIS — I10 Essential (primary) hypertension: Secondary | ICD-10-CM | POA: Diagnosis not present

## 2022-01-11 DIAGNOSIS — Z79899 Other long term (current) drug therapy: Secondary | ICD-10-CM | POA: Diagnosis not present

## 2022-01-18 DIAGNOSIS — R739 Hyperglycemia, unspecified: Secondary | ICD-10-CM | POA: Diagnosis not present

## 2022-01-18 DIAGNOSIS — Z9109 Other allergy status, other than to drugs and biological substances: Secondary | ICD-10-CM | POA: Diagnosis not present

## 2022-01-18 DIAGNOSIS — Z79899 Other long term (current) drug therapy: Secondary | ICD-10-CM | POA: Diagnosis not present

## 2022-01-18 DIAGNOSIS — Z0000 Encounter for general adult medical examination without abnormal findings: Secondary | ICD-10-CM | POA: Diagnosis not present

## 2022-01-18 DIAGNOSIS — E559 Vitamin D deficiency, unspecified: Secondary | ICD-10-CM | POA: Diagnosis not present

## 2022-01-18 DIAGNOSIS — I10 Essential (primary) hypertension: Secondary | ICD-10-CM | POA: Diagnosis not present

## 2022-01-18 DIAGNOSIS — E782 Mixed hyperlipidemia: Secondary | ICD-10-CM | POA: Diagnosis not present

## 2022-03-03 DIAGNOSIS — M25522 Pain in left elbow: Secondary | ICD-10-CM | POA: Diagnosis not present

## 2022-03-03 DIAGNOSIS — Z23 Encounter for immunization: Secondary | ICD-10-CM | POA: Diagnosis not present

## 2022-03-03 DIAGNOSIS — M79601 Pain in right arm: Secondary | ICD-10-CM | POA: Diagnosis not present

## 2022-03-03 DIAGNOSIS — S0990XA Unspecified injury of head, initial encounter: Secondary | ICD-10-CM | POA: Diagnosis not present

## 2022-03-09 ENCOUNTER — Other Ambulatory Visit: Payer: Self-pay | Admitting: Internal Medicine

## 2022-03-09 DIAGNOSIS — S0990XA Unspecified injury of head, initial encounter: Secondary | ICD-10-CM

## 2022-04-07 ENCOUNTER — Ambulatory Visit
Admission: RE | Admit: 2022-04-07 | Discharge: 2022-04-07 | Disposition: A | Discharge status: Home / Self Care | Payer: Medicare HMO | Source: Ambulatory Visit | Attending: Internal Medicine | Admitting: Internal Medicine

## 2022-04-07 DIAGNOSIS — S0990XA Unspecified injury of head, initial encounter: Secondary | ICD-10-CM | POA: Diagnosis not present

## 2022-04-07 DIAGNOSIS — R519 Headache, unspecified: Secondary | ICD-10-CM | POA: Diagnosis not present

## 2022-04-12 DIAGNOSIS — I872 Venous insufficiency (chronic) (peripheral): Secondary | ICD-10-CM | POA: Diagnosis not present

## 2022-04-12 DIAGNOSIS — I10 Essential (primary) hypertension: Secondary | ICD-10-CM | POA: Diagnosis not present

## 2022-04-12 DIAGNOSIS — E782 Mixed hyperlipidemia: Secondary | ICD-10-CM | POA: Diagnosis not present

## 2022-04-13 DIAGNOSIS — E782 Mixed hyperlipidemia: Secondary | ICD-10-CM | POA: Diagnosis not present

## 2022-04-13 DIAGNOSIS — Z79899 Other long term (current) drug therapy: Secondary | ICD-10-CM | POA: Diagnosis not present

## 2022-04-13 DIAGNOSIS — R7309 Other abnormal glucose: Secondary | ICD-10-CM | POA: Diagnosis not present

## 2022-06-02 DIAGNOSIS — R739 Hyperglycemia, unspecified: Secondary | ICD-10-CM | POA: Diagnosis not present

## 2022-06-02 DIAGNOSIS — Z79899 Other long term (current) drug therapy: Secondary | ICD-10-CM | POA: Diagnosis not present

## 2022-06-02 DIAGNOSIS — E782 Mixed hyperlipidemia: Secondary | ICD-10-CM | POA: Diagnosis not present

## 2022-06-02 DIAGNOSIS — I10 Essential (primary) hypertension: Secondary | ICD-10-CM | POA: Diagnosis not present

## 2022-06-02 DIAGNOSIS — E559 Vitamin D deficiency, unspecified: Secondary | ICD-10-CM | POA: Diagnosis not present

## 2022-07-07 DIAGNOSIS — M7989 Other specified soft tissue disorders: Secondary | ICD-10-CM | POA: Diagnosis not present

## 2022-07-07 DIAGNOSIS — M25572 Pain in left ankle and joints of left foot: Secondary | ICD-10-CM | POA: Diagnosis not present

## 2022-07-07 DIAGNOSIS — M25562 Pain in left knee: Secondary | ICD-10-CM | POA: Diagnosis not present

## 2022-07-07 DIAGNOSIS — M25532 Pain in left wrist: Secondary | ICD-10-CM | POA: Diagnosis not present

## 2022-08-29 DIAGNOSIS — E782 Mixed hyperlipidemia: Secondary | ICD-10-CM | POA: Diagnosis not present

## 2022-08-29 DIAGNOSIS — Z79899 Other long term (current) drug therapy: Secondary | ICD-10-CM | POA: Diagnosis not present

## 2022-08-29 DIAGNOSIS — I10 Essential (primary) hypertension: Secondary | ICD-10-CM | POA: Diagnosis not present

## 2022-08-29 DIAGNOSIS — R7309 Other abnormal glucose: Secondary | ICD-10-CM | POA: Diagnosis not present

## 2022-08-30 DIAGNOSIS — R8290 Unspecified abnormal findings in urine: Secondary | ICD-10-CM | POA: Diagnosis not present

## 2022-09-05 DIAGNOSIS — E559 Vitamin D deficiency, unspecified: Secondary | ICD-10-CM | POA: Diagnosis not present

## 2022-09-05 DIAGNOSIS — E785 Hyperlipidemia, unspecified: Secondary | ICD-10-CM | POA: Diagnosis not present

## 2022-09-05 DIAGNOSIS — Z0000 Encounter for general adult medical examination without abnormal findings: Secondary | ICD-10-CM | POA: Diagnosis not present

## 2022-09-05 DIAGNOSIS — R7303 Prediabetes: Secondary | ICD-10-CM | POA: Diagnosis not present

## 2022-09-05 DIAGNOSIS — K635 Polyp of colon: Secondary | ICD-10-CM | POA: Diagnosis not present

## 2022-09-05 DIAGNOSIS — I10 Essential (primary) hypertension: Secondary | ICD-10-CM | POA: Diagnosis not present

## 2022-09-05 DIAGNOSIS — Z1231 Encounter for screening mammogram for malignant neoplasm of breast: Secondary | ICD-10-CM | POA: Diagnosis not present

## 2022-09-26 ENCOUNTER — Other Ambulatory Visit: Payer: Self-pay | Admitting: Internal Medicine

## 2022-09-26 DIAGNOSIS — Z1231 Encounter for screening mammogram for malignant neoplasm of breast: Secondary | ICD-10-CM

## 2022-10-11 DIAGNOSIS — I872 Venous insufficiency (chronic) (peripheral): Secondary | ICD-10-CM | POA: Diagnosis not present

## 2022-10-11 DIAGNOSIS — E782 Mixed hyperlipidemia: Secondary | ICD-10-CM | POA: Diagnosis not present

## 2022-10-11 DIAGNOSIS — I10 Essential (primary) hypertension: Secondary | ICD-10-CM | POA: Diagnosis not present

## 2022-11-03 ENCOUNTER — Ambulatory Visit
Admission: RE | Admit: 2022-11-03 | Discharge: 2022-11-03 | Disposition: A | Discharge status: Home / Self Care | Payer: Medicare HMO | Source: Ambulatory Visit | Attending: Internal Medicine | Admitting: Internal Medicine

## 2022-11-03 DIAGNOSIS — Z1231 Encounter for screening mammogram for malignant neoplasm of breast: Secondary | ICD-10-CM | POA: Diagnosis not present

## 2022-11-08 ENCOUNTER — Other Ambulatory Visit: Payer: Self-pay | Admitting: Internal Medicine

## 2022-11-08 DIAGNOSIS — R928 Other abnormal and inconclusive findings on diagnostic imaging of breast: Secondary | ICD-10-CM

## 2022-11-08 DIAGNOSIS — N6489 Other specified disorders of breast: Secondary | ICD-10-CM

## 2022-11-09 DIAGNOSIS — R928 Other abnormal and inconclusive findings on diagnostic imaging of breast: Secondary | ICD-10-CM | POA: Diagnosis not present

## 2022-11-09 DIAGNOSIS — R232 Flushing: Secondary | ICD-10-CM | POA: Diagnosis not present

## 2022-11-09 DIAGNOSIS — Z86010 Personal history of colonic polyps: Secondary | ICD-10-CM | POA: Diagnosis not present

## 2022-11-09 DIAGNOSIS — N951 Menopausal and female climacteric states: Secondary | ICD-10-CM | POA: Diagnosis not present

## 2022-11-17 ENCOUNTER — Ambulatory Visit
Admission: RE | Admit: 2022-11-17 | Discharge: 2022-11-17 | Disposition: A | Discharge status: Home / Self Care | Payer: Medicare HMO | Source: Ambulatory Visit | Attending: Internal Medicine | Admitting: Internal Medicine

## 2022-11-17 DIAGNOSIS — N6489 Other specified disorders of breast: Secondary | ICD-10-CM

## 2022-11-17 DIAGNOSIS — R928 Other abnormal and inconclusive findings on diagnostic imaging of breast: Secondary | ICD-10-CM

## 2022-11-17 DIAGNOSIS — R92332 Mammographic heterogeneous density, left breast: Secondary | ICD-10-CM | POA: Diagnosis not present

## 2022-11-17 DIAGNOSIS — N6322 Unspecified lump in the left breast, upper inner quadrant: Secondary | ICD-10-CM | POA: Diagnosis not present

## 2022-11-22 ENCOUNTER — Other Ambulatory Visit: Payer: Self-pay | Admitting: Internal Medicine

## 2022-11-22 DIAGNOSIS — N63 Unspecified lump in unspecified breast: Secondary | ICD-10-CM

## 2022-11-22 DIAGNOSIS — R928 Other abnormal and inconclusive findings on diagnostic imaging of breast: Secondary | ICD-10-CM

## 2022-11-24 ENCOUNTER — Ambulatory Visit
Admission: RE | Admit: 2022-11-24 | Discharge: 2022-11-24 | Disposition: A | Discharge status: Home / Self Care | Payer: Medicare HMO | Source: Ambulatory Visit | Attending: Internal Medicine | Admitting: Internal Medicine

## 2022-11-24 DIAGNOSIS — N63 Unspecified lump in unspecified breast: Secondary | ICD-10-CM | POA: Insufficient documentation

## 2022-11-24 DIAGNOSIS — R928 Other abnormal and inconclusive findings on diagnostic imaging of breast: Secondary | ICD-10-CM | POA: Insufficient documentation

## 2022-11-24 DIAGNOSIS — N6012 Diffuse cystic mastopathy of left breast: Secondary | ICD-10-CM | POA: Diagnosis not present

## 2022-11-24 HISTORY — PX: BREAST BIOPSY: SHX20

## 2022-11-24 MED ORDER — LIDOCAINE 1 % OPTIME INJ - NO CHARGE
2.0000 mL | Freq: Once | INTRAMUSCULAR | Status: AC
  Administered 2022-11-24: 2 mL via INTRADERMAL
  Filled 2022-11-24: qty 2

## 2022-11-24 MED ORDER — LIDOCAINE-EPINEPHRINE 1 %-1:100000 IJ SOLN
8.0000 mL | Freq: Once | INTRAMUSCULAR | Status: AC
  Administered 2022-11-24: 8 mL
  Filled 2022-11-24: qty 8

## 2022-12-07 DIAGNOSIS — E559 Vitamin D deficiency, unspecified: Secondary | ICD-10-CM | POA: Diagnosis not present

## 2022-12-07 DIAGNOSIS — R7303 Prediabetes: Secondary | ICD-10-CM | POA: Diagnosis not present

## 2022-12-07 DIAGNOSIS — I10 Essential (primary) hypertension: Secondary | ICD-10-CM | POA: Diagnosis not present

## 2022-12-07 DIAGNOSIS — Z79899 Other long term (current) drug therapy: Secondary | ICD-10-CM | POA: Diagnosis not present

## 2022-12-07 DIAGNOSIS — E782 Mixed hyperlipidemia: Secondary | ICD-10-CM | POA: Diagnosis not present

## 2023-02-23 DIAGNOSIS — I10 Essential (primary) hypertension: Secondary | ICD-10-CM | POA: Diagnosis not present

## 2023-02-23 DIAGNOSIS — R7303 Prediabetes: Secondary | ICD-10-CM | POA: Diagnosis not present

## 2023-02-23 DIAGNOSIS — E559 Vitamin D deficiency, unspecified: Secondary | ICD-10-CM | POA: Diagnosis not present

## 2023-02-23 DIAGNOSIS — E782 Mixed hyperlipidemia: Secondary | ICD-10-CM | POA: Diagnosis not present

## 2023-02-23 DIAGNOSIS — Z79899 Other long term (current) drug therapy: Secondary | ICD-10-CM | POA: Diagnosis not present

## 2023-02-23 DIAGNOSIS — Z23 Encounter for immunization: Secondary | ICD-10-CM | POA: Diagnosis not present

## 2023-04-13 DIAGNOSIS — I10 Essential (primary) hypertension: Secondary | ICD-10-CM | POA: Diagnosis not present

## 2023-04-13 DIAGNOSIS — I872 Venous insufficiency (chronic) (peripheral): Secondary | ICD-10-CM | POA: Diagnosis not present

## 2023-04-13 DIAGNOSIS — E782 Mixed hyperlipidemia: Secondary | ICD-10-CM | POA: Diagnosis not present

## 2023-04-19 DIAGNOSIS — E782 Mixed hyperlipidemia: Secondary | ICD-10-CM | POA: Diagnosis not present

## 2023-04-19 DIAGNOSIS — R002 Palpitations: Secondary | ICD-10-CM | POA: Diagnosis not present

## 2023-04-19 DIAGNOSIS — E559 Vitamin D deficiency, unspecified: Secondary | ICD-10-CM | POA: Diagnosis not present

## 2023-04-19 DIAGNOSIS — I10 Essential (primary) hypertension: Secondary | ICD-10-CM | POA: Diagnosis not present

## 2023-04-19 DIAGNOSIS — Z0000 Encounter for general adult medical examination without abnormal findings: Secondary | ICD-10-CM | POA: Diagnosis not present

## 2023-04-19 DIAGNOSIS — R7303 Prediabetes: Secondary | ICD-10-CM | POA: Diagnosis not present

## 2023-04-19 DIAGNOSIS — Z79899 Other long term (current) drug therapy: Secondary | ICD-10-CM | POA: Diagnosis not present

## 2023-05-02 DIAGNOSIS — I10 Essential (primary) hypertension: Secondary | ICD-10-CM | POA: Diagnosis not present

## 2023-05-02 DIAGNOSIS — R079 Chest pain, unspecified: Secondary | ICD-10-CM | POA: Diagnosis not present

## 2023-05-02 DIAGNOSIS — R0602 Shortness of breath: Secondary | ICD-10-CM | POA: Diagnosis not present

## 2023-05-02 DIAGNOSIS — I872 Venous insufficiency (chronic) (peripheral): Secondary | ICD-10-CM | POA: Diagnosis not present

## 2023-05-02 DIAGNOSIS — E782 Mixed hyperlipidemia: Secondary | ICD-10-CM | POA: Diagnosis not present

## 2023-05-16 DIAGNOSIS — R0602 Shortness of breath: Secondary | ICD-10-CM | POA: Diagnosis not present

## 2023-05-16 DIAGNOSIS — R0789 Other chest pain: Secondary | ICD-10-CM | POA: Diagnosis not present

## 2023-09-25 ENCOUNTER — Other Ambulatory Visit: Payer: Self-pay | Admitting: Surgery

## 2023-09-25 DIAGNOSIS — Z1231 Encounter for screening mammogram for malignant neoplasm of breast: Secondary | ICD-10-CM

## 2023-10-11 DIAGNOSIS — E785 Hyperlipidemia, unspecified: Secondary | ICD-10-CM | POA: Diagnosis not present

## 2023-10-11 DIAGNOSIS — R7303 Prediabetes: Secondary | ICD-10-CM | POA: Diagnosis not present

## 2023-10-11 DIAGNOSIS — Z79899 Other long term (current) drug therapy: Secondary | ICD-10-CM | POA: Diagnosis not present

## 2023-10-11 DIAGNOSIS — E559 Vitamin D deficiency, unspecified: Secondary | ICD-10-CM | POA: Diagnosis not present

## 2023-10-11 DIAGNOSIS — Z0000 Encounter for general adult medical examination without abnormal findings: Secondary | ICD-10-CM | POA: Diagnosis not present

## 2023-10-11 DIAGNOSIS — I10 Essential (primary) hypertension: Secondary | ICD-10-CM | POA: Diagnosis not present

## 2023-11-06 ENCOUNTER — Encounter

## 2023-11-22 DIAGNOSIS — I872 Venous insufficiency (chronic) (peripheral): Secondary | ICD-10-CM | POA: Diagnosis not present

## 2023-11-22 DIAGNOSIS — E782 Mixed hyperlipidemia: Secondary | ICD-10-CM | POA: Diagnosis not present

## 2023-11-22 DIAGNOSIS — I10 Essential (primary) hypertension: Secondary | ICD-10-CM | POA: Diagnosis not present

## 2024-01-09 ENCOUNTER — Ambulatory Visit
Admission: RE | Admit: 2024-01-09 | Discharge: 2024-01-09 | Disposition: A | Discharge status: Home / Self Care | Source: Ambulatory Visit | Attending: Surgery | Admitting: Surgery

## 2024-01-09 DIAGNOSIS — Z1231 Encounter for screening mammogram for malignant neoplasm of breast: Secondary | ICD-10-CM | POA: Insufficient documentation

## 2024-01-16 DIAGNOSIS — Z1231 Encounter for screening mammogram for malignant neoplasm of breast: Secondary | ICD-10-CM | POA: Diagnosis not present

## 2024-04-22 DIAGNOSIS — E559 Vitamin D deficiency, unspecified: Secondary | ICD-10-CM | POA: Diagnosis not present

## 2024-04-22 DIAGNOSIS — I10 Essential (primary) hypertension: Secondary | ICD-10-CM | POA: Diagnosis not present

## 2024-04-22 DIAGNOSIS — E782 Mixed hyperlipidemia: Secondary | ICD-10-CM | POA: Diagnosis not present

## 2024-04-22 DIAGNOSIS — Z0000 Encounter for general adult medical examination without abnormal findings: Secondary | ICD-10-CM | POA: Diagnosis not present

## 2024-04-22 DIAGNOSIS — R002 Palpitations: Secondary | ICD-10-CM | POA: Diagnosis not present

## 2024-04-22 DIAGNOSIS — R7303 Prediabetes: Secondary | ICD-10-CM | POA: Diagnosis not present

## 2024-04-22 DIAGNOSIS — Z1331 Encounter for screening for depression: Secondary | ICD-10-CM | POA: Diagnosis not present

## 2024-04-22 DIAGNOSIS — Z79899 Other long term (current) drug therapy: Secondary | ICD-10-CM | POA: Diagnosis not present

## 2024-04-23 DIAGNOSIS — H43813 Vitreous degeneration, bilateral: Secondary | ICD-10-CM | POA: Diagnosis not present

## 2024-04-23 DIAGNOSIS — H2513 Age-related nuclear cataract, bilateral: Secondary | ICD-10-CM | POA: Diagnosis not present
